# Patient Record
Sex: Female | Born: 1945 | Race: Black or African American | Hispanic: No | Smoking: Former smoker
Health system: Southern US, Community
[De-identification: ages and names within clinical notes are randomized; demographics above are authoritative.]

## PROBLEM LIST (undated history)

## (undated) DIAGNOSIS — I219 Acute myocardial infarction, unspecified: Secondary | ICD-10-CM

## (undated) DIAGNOSIS — E7800 Pure hypercholesterolemia, unspecified: Secondary | ICD-10-CM

## (undated) DIAGNOSIS — I10 Essential (primary) hypertension: Secondary | ICD-10-CM

## (undated) DIAGNOSIS — I2510 Atherosclerotic heart disease of native coronary artery without angina pectoris: Secondary | ICD-10-CM

## (undated) DIAGNOSIS — M8580 Other specified disorders of bone density and structure, unspecified site: Principal | ICD-10-CM

## (undated) DIAGNOSIS — M1990 Unspecified osteoarthritis, unspecified site: Secondary | ICD-10-CM

## (undated) DIAGNOSIS — Z9889 Other specified postprocedural states: Secondary | ICD-10-CM

## (undated) DIAGNOSIS — N200 Calculus of kidney: Secondary | ICD-10-CM

## (undated) DIAGNOSIS — K589 Irritable bowel syndrome without diarrhea: Secondary | ICD-10-CM

## (undated) DIAGNOSIS — K529 Noninfective gastroenteritis and colitis, unspecified: Secondary | ICD-10-CM

## (undated) DIAGNOSIS — N879 Dysplasia of cervix uteri, unspecified: Secondary | ICD-10-CM

## (undated) DIAGNOSIS — I459 Conduction disorder, unspecified: Secondary | ICD-10-CM

## (undated) DIAGNOSIS — K219 Gastro-esophageal reflux disease without esophagitis: Secondary | ICD-10-CM

## (undated) DIAGNOSIS — Z8719 Personal history of other diseases of the digestive system: Secondary | ICD-10-CM

## (undated) HISTORY — DX: Calculus of kidney: N20.0

## (undated) HISTORY — DX: Essential (primary) hypertension: I10

## (undated) HISTORY — DX: Dysplasia of cervix uteri, unspecified: N87.9

## (undated) HISTORY — DX: Irritable bowel syndrome, unspecified: K58.9

## (undated) HISTORY — DX: Noninfective gastroenteritis and colitis, unspecified: K52.9

## (undated) HISTORY — DX: Pure hypercholesterolemia, unspecified: E78.00

## (undated) HISTORY — DX: Conduction disorder, unspecified: I45.9

## (undated) HISTORY — PX: GYNECOLOGIC CRYOSURGERY: SHX857

## (undated) HISTORY — PX: APPENDECTOMY: SHX54

## (undated) HISTORY — DX: Other specified disorders of bone density and structure, unspecified site: M85.80

---

## 1986-06-01 DIAGNOSIS — N879 Dysplasia of cervix uteri, unspecified: Secondary | ICD-10-CM

## 1986-06-01 HISTORY — DX: Dysplasia of cervix uteri, unspecified: N87.9

## 1997-10-24 ENCOUNTER — Other Ambulatory Visit: Admission: RE | Admit: 1997-10-24 | Discharge: 1997-10-24 | Payer: Self-pay | Admitting: Obstetrics and Gynecology

## 1999-11-20 ENCOUNTER — Other Ambulatory Visit: Admission: RE | Admit: 1999-11-20 | Discharge: 1999-11-20 | Payer: Self-pay | Admitting: Obstetrics and Gynecology

## 2002-11-23 ENCOUNTER — Other Ambulatory Visit: Admission: RE | Admit: 2002-11-23 | Discharge: 2002-11-23 | Payer: Self-pay | Admitting: Obstetrics and Gynecology

## 2004-01-25 ENCOUNTER — Encounter: Admission: RE | Admit: 2004-01-25 | Discharge: 2004-01-25 | Payer: Self-pay | Admitting: Internal Medicine

## 2004-04-17 ENCOUNTER — Other Ambulatory Visit: Admission: RE | Admit: 2004-04-17 | Discharge: 2004-04-17 | Payer: Self-pay | Admitting: Obstetrics and Gynecology

## 2004-08-01 ENCOUNTER — Encounter: Admission: RE | Admit: 2004-08-01 | Discharge: 2004-08-01 | Payer: Self-pay | Admitting: Internal Medicine

## 2004-11-13 ENCOUNTER — Ambulatory Visit (HOSPITAL_COMMUNITY): Admission: RE | Admit: 2004-11-13 | Discharge: 2004-11-13 | Payer: Self-pay | Admitting: Gastroenterology

## 2004-11-13 ENCOUNTER — Encounter (INDEPENDENT_AMBULATORY_CARE_PROVIDER_SITE_OTHER): Payer: Self-pay | Admitting: *Deleted

## 2005-06-22 ENCOUNTER — Other Ambulatory Visit: Admission: RE | Admit: 2005-06-22 | Discharge: 2005-06-22 | Payer: Self-pay | Admitting: Obstetrics and Gynecology

## 2006-04-12 ENCOUNTER — Encounter: Admission: RE | Admit: 2006-04-12 | Discharge: 2006-04-12 | Payer: Self-pay | Admitting: Internal Medicine

## 2007-05-27 ENCOUNTER — Encounter: Admission: RE | Admit: 2007-05-27 | Discharge: 2007-05-27 | Payer: Self-pay | Admitting: Internal Medicine

## 2007-06-01 ENCOUNTER — Other Ambulatory Visit: Admission: RE | Admit: 2007-06-01 | Discharge: 2007-06-01 | Payer: Self-pay | Admitting: Obstetrics and Gynecology

## 2007-06-02 HISTORY — PX: OTHER SURGICAL HISTORY: SHX169

## 2007-06-02 HISTORY — PX: CARDIAC CATHETERIZATION: SHX172

## 2008-06-06 ENCOUNTER — Encounter: Admission: RE | Admit: 2008-06-06 | Discharge: 2008-06-06 | Payer: Self-pay | Admitting: Internal Medicine

## 2009-09-12 ENCOUNTER — Encounter: Admission: RE | Admit: 2009-09-12 | Discharge: 2009-09-12 | Payer: Self-pay | Admitting: Internal Medicine

## 2009-09-25 ENCOUNTER — Other Ambulatory Visit: Admission: RE | Admit: 2009-09-25 | Discharge: 2009-09-25 | Payer: Self-pay | Admitting: Obstetrics and Gynecology

## 2009-09-25 ENCOUNTER — Ambulatory Visit: Payer: Self-pay | Admitting: Obstetrics and Gynecology

## 2009-12-01 ENCOUNTER — Emergency Department (HOSPITAL_BASED_OUTPATIENT_CLINIC_OR_DEPARTMENT_OTHER): Admission: EM | Admit: 2009-12-01 | Discharge: 2009-12-01 | Payer: Self-pay | Admitting: Emergency Medicine

## 2010-10-03 ENCOUNTER — Other Ambulatory Visit: Payer: Self-pay | Admitting: Internal Medicine

## 2010-10-03 DIAGNOSIS — Z1231 Encounter for screening mammogram for malignant neoplasm of breast: Secondary | ICD-10-CM

## 2010-10-17 NOTE — Op Note (Signed)
Erika Russell, Erika Russell            ACCOUNT NO.:  [id number]   MEDICAL RECORD NO.:  [id number]          PATIENT TYPE:  AMB   LOCATION:  ENDO                         FACILITY:  [facility]   PHYSICIAN:  James L. Malon Kindle., M.D.DATE OF BIRTH:  1946-03-18   DATE OF PROCEDURE:  11/13/2004  DATE OF DISCHARGE:                                 OPERATIVE REPORT   PROCEDURE:  Colonoscopy and polypectomy.   MEDICATIONS:  1.  Fentanyl 50 mcg.  2.  Versed 6 mg IV.   SCOPE:  Olympus pediatric adjustable colonoscope.   INDICATION:  Strong family history of colon cancer in her father.   DESCRIPTION OF PROCEDURE:  The procedure explained to the patient and  consent obtained.  With the patient in the left lateral decubitus position,  the Olympus scope inserted and advanced.  Prep excellent, cecum reached,  ileocecal valve and appendiceal orifice seen.  The scope withdrawn, colon  carefully examined upon removal.  Mucosa normal throughout.  No polyps seen  throughout the entire colon.  No diverticulosis.  In the rectum, two small  polyps 3-5 mm were seen and removed with a snare.  Scope withdrawn.  The  patient tolerated the procedure well.   ASSESSMENT:  1.  Rectal polyps, removed.  211.4.  2.  Family history of colon cancer.  V16.0.   PLAN:  Recommend yearly Hemoccults and 5 year repeat colonoscopy.  Routine  postpolypectomy instructions.       JLE/MEDQ  D:  11/13/2004  T:  11/13/2004  Job:  [id number]   cc:   Wilson Singer, M.D.  [address]  [place]  [place] [address]  Fax: [phone]

## 2010-10-20 ENCOUNTER — Ambulatory Visit
Admission: RE | Admit: 2010-10-20 | Discharge: 2010-10-20 | Disposition: A | Discharge status: Home / Self Care | Source: Ambulatory Visit | Attending: Internal Medicine | Admitting: Internal Medicine

## 2010-10-20 DIAGNOSIS — Z1231 Encounter for screening mammogram for malignant neoplasm of breast: Secondary | ICD-10-CM

## 2011-05-28 ENCOUNTER — Encounter: Payer: Self-pay | Admitting: *Deleted

## 2011-05-28 DIAGNOSIS — D4989 Neoplasm of unspecified behavior of other specified sites: Secondary | ICD-10-CM | POA: Insufficient documentation

## 2011-06-04 ENCOUNTER — Encounter: Admitting: Obstetrics and Gynecology

## 2011-06-22 ENCOUNTER — Ambulatory Visit (INDEPENDENT_AMBULATORY_CARE_PROVIDER_SITE_OTHER): Payer: Medicare Other | Admitting: Obstetrics and Gynecology

## 2011-06-22 ENCOUNTER — Encounter: Payer: Self-pay | Admitting: Obstetrics and Gynecology

## 2011-06-22 ENCOUNTER — Other Ambulatory Visit (HOSPITAL_COMMUNITY)
Admission: RE | Admit: 2011-06-22 | Discharge: 2011-06-22 | Disposition: A | Discharge status: Home / Self Care | Payer: Medicare Other | Source: Ambulatory Visit | Attending: Obstetrics and Gynecology | Admitting: Obstetrics and Gynecology

## 2011-06-22 VITALS — BP 120/70 | Ht 64.0 in | Wt 132.0 lb

## 2011-06-22 DIAGNOSIS — Z124 Encounter for screening for malignant neoplasm of cervix: Secondary | ICD-10-CM

## 2011-06-22 DIAGNOSIS — N951 Menopausal and female climacteric states: Secondary | ICD-10-CM

## 2011-06-22 DIAGNOSIS — Z780 Asymptomatic menopausal state: Secondary | ICD-10-CM

## 2011-06-22 DIAGNOSIS — I459 Conduction disorder, unspecified: Secondary | ICD-10-CM | POA: Insufficient documentation

## 2011-06-22 DIAGNOSIS — M899 Disorder of bone, unspecified: Secondary | ICD-10-CM

## 2011-06-22 DIAGNOSIS — E7800 Pure hypercholesterolemia, unspecified: Secondary | ICD-10-CM | POA: Insufficient documentation

## 2011-06-22 DIAGNOSIS — B356 Tinea cruris: Secondary | ICD-10-CM

## 2011-06-22 DIAGNOSIS — M8580 Other specified disorders of bone density and structure, unspecified site: Secondary | ICD-10-CM

## 2011-06-22 DIAGNOSIS — N871 Moderate cervical dysplasia: Secondary | ICD-10-CM

## 2011-06-22 MED ORDER — OXICONAZOLE NITRATE 1 % EX LOTN: TOPICAL_LOTION | Freq: Two times a day (BID) | CUTANEOUS | Status: DC

## 2011-06-22 NOTE — Patient Instructions (Signed)
Get a mammogram and bone density in May.

## 2011-06-22 NOTE — Progress Notes (Signed)
Patient came to see me today for further follow up. She is having some hot flashes. She notices when she gets really warm that she gets itching in her lower abdomen in the panniculus. 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 She is osteopenia and is due for followup bone density. She takes calcium and vitamin D. She's not had any fractures. She is not having vaginal bleeding. She is not having pelvic pain. She does not have dysuria, frequency, urgency or incontinence.  Review of systems: 12 systems reviewed. She pertinent positives above. Other positive is cardiovascular system with hyperlipidemia and heart block.  Physical examination: Kennon Portela present. HEENT within normal limits. Neck: Thyroid not large. No masses. Supraclavicular nodes: not enlarged. Breasts: Examined in both sitting midline position. No skin changes and no masses. Abdomen: Soft no guarding rebound or masses or hernia. Pelvic: External: Within normal limits. BUS: Within normal limits. Vaginal:within normal limits. Good estrogen effect. No evidence of cystocele rectocele or enterocele. Cervix: clean. Uterus: Normal size and shape. Adnexa: No masses. Rectovaginal exam: Confirmatory and negative. Extremities: Within normal limits.  Assessment: #1. Cutaneous yeast infections #2. Night sweats #3. CIN #4. Osteopenia  Plan: Oxistat lotion ordered. Mammogram and  bone density in may. Observation of night sweats.

## 2011-06-23 ENCOUNTER — Telehealth: Payer: Self-pay | Admitting: *Deleted

## 2011-06-23 MED ORDER — OXICONAZOLE NITRATE 1 % EX CREA: TOPICAL_CREAM | CUTANEOUS | Status: DC

## 2011-06-23 NOTE — Telephone Encounter (Signed)
Pt was seen yesterday and rx for Oxistat 1 % lotion e-scribed. Pharmacy called and said that Oxistat doesn't come in a lotion, it comes in a cream. Okay to send cream?

## 2011-06-23 NOTE — Telephone Encounter (Signed)
Give her cream. When I tried to put cream in the computer only lotion came up so be careful when you prescribed it.

## 2011-06-23 NOTE — Telephone Encounter (Signed)
New rx sent

## 2011-12-07 ENCOUNTER — Other Ambulatory Visit: Payer: Self-pay | Admitting: Internal Medicine

## 2011-12-07 DIAGNOSIS — Z1231 Encounter for screening mammogram for malignant neoplasm of breast: Secondary | ICD-10-CM

## 2011-12-21 ENCOUNTER — Ambulatory Visit
Admission: RE | Admit: 2011-12-21 | Discharge: 2011-12-21 | Disposition: A | Discharge status: Home / Self Care | Payer: Medicare Other | Source: Ambulatory Visit | Attending: Internal Medicine | Admitting: Internal Medicine

## 2011-12-21 DIAGNOSIS — Z1231 Encounter for screening mammogram for malignant neoplasm of breast: Secondary | ICD-10-CM

## 2012-03-08 ENCOUNTER — Other Ambulatory Visit: Payer: Self-pay | Admitting: Internal Medicine

## 2012-03-08 DIAGNOSIS — M810 Age-related osteoporosis without current pathological fracture: Secondary | ICD-10-CM

## 2012-03-21 ENCOUNTER — Ambulatory Visit
Admission: RE | Admit: 2012-03-21 | Discharge: 2012-03-21 | Disposition: A | Discharge status: Home / Self Care | Payer: Medicare Other | Source: Ambulatory Visit | Attending: Internal Medicine | Admitting: Internal Medicine

## 2012-03-21 DIAGNOSIS — M810 Age-related osteoporosis without current pathological fracture: Secondary | ICD-10-CM

## 2012-03-21 DIAGNOSIS — M8580 Other specified disorders of bone density and structure, unspecified site: Secondary | ICD-10-CM

## 2012-07-28 ENCOUNTER — Ambulatory Visit (INDEPENDENT_AMBULATORY_CARE_PROVIDER_SITE_OTHER): Payer: Medicare Other | Admitting: Gynecology

## 2012-07-28 ENCOUNTER — Encounter: Payer: Self-pay | Admitting: Gynecology

## 2012-07-28 VITALS — BP 120/66 | Ht 64.0 in | Wt 144.0 lb

## 2012-07-28 DIAGNOSIS — L293 Anogenital pruritus, unspecified: Secondary | ICD-10-CM

## 2012-07-28 DIAGNOSIS — M949 Disorder of cartilage, unspecified: Secondary | ICD-10-CM

## 2012-07-28 DIAGNOSIS — L292 Pruritus vulvae: Secondary | ICD-10-CM

## 2012-07-28 DIAGNOSIS — M8580 Other specified disorders of bone density and structure, unspecified site: Secondary | ICD-10-CM

## 2012-07-28 DIAGNOSIS — N952 Postmenopausal atrophic vaginitis: Secondary | ICD-10-CM

## 2012-07-28 MED ORDER — NYSTATIN-TRIAMCINOLONE 100000-0.1 UNIT/GM-% EX OINT: TOPICAL_OINTMENT | Freq: Two times a day (BID) | CUTANEOUS | Status: DC

## 2012-07-28 NOTE — Progress Notes (Signed)
Erika Russell Aug 29, 1945 [id number]        67 y.o.  [id number] for annual exam.  Former patient Dr. Eda Paschal doing well.  Past medical history,surgical history, medications, allergies, family history and social history were all reviewed and documented in the EPIC chart. ROS:  Was performed and pertinent positives and negatives are included in the history.  Exam: Kim assistant Filed Vitals:   07/28/12 1202  BP: 120/66  Height: 5\' 4"  (1.626 m)  Weight: 144 lb (65.318 kg)   General appearance  Normal Skin grossly normal Head/Neck normal with no cervical or supraclavicular adenopathy thyroid normal Lungs  clear Cardiac RR, without RMG Abdominal  soft, nontender, without masses, organomegaly or hernia Breasts  examined lying and sitting without masses, retractions, discharge or axillary adenopathy. Pelvic  Ext/BUS/vagina  normal with atrophic changes  Cervix  normal with atrophic changes  Uterus  Anteverted the axial, normal size, shape and contour, midline and mobile nontender   Adnexa  Without masses or tenderness    Anus and perineum  normal   Rectovaginal  normal sphincter tone without palpated masses or tenderness.    Assessment/Plan:  67 y.o. [name] female for annual exam.   1. Postmenopausal/atrophic vaginitis. Doing well without significant symptoms. We'll continue to monitor. Patient knows to report any bleeding. 2. Mons pubis itching. Patient does have itching in the mons pubis area intermittently particularly when she sweats. Exam is normal. I gave her prescription for Mytrex cream to apply when necessary to see if this doesn't help. Follow up if it persists. 3. Osteopenia. DEXA 03/2012 with T score -1.5. FRAX 4%/0.4%. Stable from prior studies. Increase calcium vitamin D reviewed. Repeat DEXA in another year or 2. 4. Pap smear 2013. No Pap smear done today. History of cryosurgery for abnormal Pap smear 1988 with normal Pap smears since then. Patient is over the age of 58  and options to stop screening altogether versus less frequent screening interval reviewed. We'll readdress on an annual basis. 5. Mammogram 11/2011. Repeat this summer. SBE monthly reviewed. 6. Colonoscopy 5 years ago. Repeat at recommended interval. 7. Health maintenance. The blood work ordered as is done through her primary physician's office. Follow up one year, sooner as needed.    Dara Lords MD, 12:31 PM 07/28/2012

## 2012-07-28 NOTE — Patient Instructions (Addendum)
Apply cream to itchy skin as needed Follow up for annual exam in one year

## 2012-07-29 LAB — URINALYSIS W MICROSCOPIC + REFLEX CULTURE
Casts: NONE SEEN
Crystals: NONE SEEN
Glucose, UA: NEGATIVE mg/dL
Hgb urine dipstick: NEGATIVE
Ketones, ur: NEGATIVE mg/dL
Leukocytes, UA: NEGATIVE
Specific Gravity, Urine: 1.015 (ref 1.005–1.030)
pH: 7 (ref 5.0–8.0)

## 2013-02-07 ENCOUNTER — Other Ambulatory Visit: Payer: Self-pay

## 2013-02-07 DIAGNOSIS — Z1231 Encounter for screening mammogram for malignant neoplasm of breast: Secondary | ICD-10-CM

## 2013-02-16 ENCOUNTER — Emergency Department (HOSPITAL_BASED_OUTPATIENT_CLINIC_OR_DEPARTMENT_OTHER)
Admission: EM | Admit: 2013-02-16 | Discharge: 2013-02-16 | Disposition: A | Discharge status: Home / Self Care | Payer: Medicare Other | Attending: Emergency Medicine | Admitting: Emergency Medicine

## 2013-02-16 ENCOUNTER — Encounter (HOSPITAL_BASED_OUTPATIENT_CLINIC_OR_DEPARTMENT_OTHER): Payer: Self-pay

## 2013-02-16 DIAGNOSIS — Z8739 Personal history of other diseases of the musculoskeletal system and connective tissue: Secondary | ICD-10-CM | POA: Insufficient documentation

## 2013-02-16 DIAGNOSIS — E7800 Pure hypercholesterolemia, unspecified: Secondary | ICD-10-CM | POA: Insufficient documentation

## 2013-02-16 DIAGNOSIS — Z7982 Long term (current) use of aspirin: Secondary | ICD-10-CM | POA: Insufficient documentation

## 2013-02-16 DIAGNOSIS — Z87891 Personal history of nicotine dependence: Secondary | ICD-10-CM | POA: Insufficient documentation

## 2013-02-16 DIAGNOSIS — I10 Essential (primary) hypertension: Secondary | ICD-10-CM | POA: Insufficient documentation

## 2013-02-16 DIAGNOSIS — Z79899 Other long term (current) drug therapy: Secondary | ICD-10-CM | POA: Insufficient documentation

## 2013-02-16 DIAGNOSIS — Z7902 Long term (current) use of antithrombotics/antiplatelets: Secondary | ICD-10-CM | POA: Insufficient documentation

## 2013-02-16 DIAGNOSIS — E876 Hypokalemia: Secondary | ICD-10-CM

## 2013-02-16 DIAGNOSIS — Z8742 Personal history of other diseases of the female genital tract: Secondary | ICD-10-CM | POA: Insufficient documentation

## 2013-02-16 DIAGNOSIS — Z8719 Personal history of other diseases of the digestive system: Secondary | ICD-10-CM | POA: Insufficient documentation

## 2013-02-16 DIAGNOSIS — I459 Conduction disorder, unspecified: Secondary | ICD-10-CM | POA: Insufficient documentation

## 2013-02-16 LAB — CBC WITH DIFFERENTIAL/PLATELET
Basophils Absolute: 0.1 10*3/uL (ref 0.0–0.1)
Eosinophils Absolute: 0.1 10*3/uL (ref 0.0–0.7)
HCT: 32.1 % — ABNORMAL LOW (ref 36.0–46.0)
Lymphocytes Relative: 45 % (ref 12–46)
MCHC: 34.6 g/dL (ref 30.0–36.0)
Neutro Abs: 3.3 10*3/uL (ref 1.7–7.7)
RDW: 15.5 % (ref 11.5–15.5)

## 2013-02-16 LAB — BASIC METABOLIC PANEL
BUN: 12 mg/dL (ref 6–23)
Calcium: 9.2 mg/dL (ref 8.4–10.5)
GFR calc non Af Amer: 88 mL/min — ABNORMAL LOW (ref >90)
Glucose, Bld: 94 mg/dL (ref 70–99)
Sodium: 140 mEq/L (ref 135–145)

## 2013-02-16 MED ORDER — POTASSIUM CHLORIDE CRYS ER 20 MEQ PO TBCR
40.0000 meq | EXTENDED_RELEASE_TABLET | Freq: Two times a day (BID) | ORAL | Status: DC
  Administered 2013-02-16: 40 meq via ORAL

## 2013-02-16 MED ORDER — POTASSIUM CHLORIDE CRYS ER 20 MEQ PO TBCR: 40.0000 meq | EXTENDED_RELEASE_TABLET | Freq: Once | ORAL | Status: AC

## 2013-02-16 MED ORDER — POTASSIUM CHLORIDE CRYS ER 20 MEQ PO TBCR
EXTENDED_RELEASE_TABLET | ORAL | Status: AC
  Filled 2013-02-16: qty 2

## 2013-02-16 NOTE — ED Provider Notes (Signed)
CSN: [id number]     Arrival date & time 02/16/13  1630 History  This chart was scribed for non-physician practitioner, Langston Masker, PA-C working with Rolan Bucco, MD by Greggory Stallion, ED scribe. This patient was seen in room MH05/MH05 and the patient's care was started at 6:22 PM.   Chief Complaint  Patient presents with  . Hypertension   The history is provided by the patient. No language interpreter was used.    HPI Comments: Erika Russell is a 67 y.o. female who presents to the Emergency Department complaining of hypertension that started 4 days ago. She went to her PCP 4 days ago for the same but was treated for sinusitis with an antibiotic and a steroid. Pt has been on the antibiotic for 2 days. She was also prescribed Levaquin and tessalon. Pt states she went to the gym 3 days ago and her blood pressure was taken and states her systolic was 186. She took her blood pressure at [facility] and it was 206 then went back down to 186. Pt denies CP and productive cough. Pt states she smokes cigarettes sometimes but rarely.   Cardiologist is Dr. Jeralene Huff    Past Medical History  Diagnosis Date  . Hypertension   . Elevated cholesterol   . Heart block   . IBS (irritable bowel syndrome)   . Cervical dysplasia 1988  . Osteopenia 03/2012    t score - 1.5 FRAX 4%/0.4%   Past Surgical History  Procedure Laterality Date  . Cardiac stint  09  . Appendectomy    . Colposcopy    . Gynecologic cryosurgery     Family History  Problem Relation Age of Onset  . Cancer Father     COLON  . Diabetes Father   . Breast cancer Sister     Age 51's   History  Substance Use Topics  . Smoking status: Former Games developer  . Smokeless tobacco: Never Used  . Alcohol Use: 0.0 oz/week     Comment: red wine x 1 nightly   OB History   Grav Para Term Preterm Abortions TAB SAB Ect Mult Living   3 2 2  1  1   2      Review of Systems  Respiratory: Negative for cough.   Cardiovascular: Negative for chest  pain.       Positive hypertension  All other systems reviewed and are negative.    Allergies  Codeine  Home Medications   Current Outpatient Rx  Name  Route  Sig  Dispense  Refill  . aspirin 81 MG tablet   Oral   Take 81 mg by mouth daily.         . Atorvastatin Calcium (LIPITOR PO)   Oral   Take by mouth.           . Calcium Carbonate-Vitamin D (CALCIUM + D PO)   Oral   Take by mouth.           . cetirizine (ZYRTEC) 10 MG tablet   Oral   Take 10 mg by mouth daily.           . Cholecalciferol (VITAMIN D) 400 UNITS capsule   Oral   Take 400 Units by mouth daily.           Marland Kitchen Clopidogrel Bisulfate (PLAVIX PO)   Oral   Take by mouth.           . Coenzyme Q10 (CO Q-10 PO)   Oral  Take by mouth.           . Dexlansoprazole (DEXILANT PO)   Oral   Take by mouth.         . GLYCOPYRROLATE PO   Oral   Take by mouth.         . metoprolol succinate (TOPROL XL) 25 MG 24 hr tablet   Oral   Take 25 mg by mouth daily.         . Multiple Vitamin (MULTIVITAMIN) tablet   Oral   Take 1 tablet by mouth daily.           Marland Kitchen nystatin-triamcinolone ointment (MYCOLOG)   Topical   Apply topically 2 (two) times daily.   30 g   1   . oxiconazole (OXISTAT) 1 % CREA      Apply tropically 2 times daily   30 each   2   . Valsartan (DIOVAN PO)   Oral   Take by mouth.            BP 184/81  Pulse 71  Temp(Src) 99.3 F (37.4 C) (Oral)  Resp 16  Ht 5\' 3"  (1.6 m)  Wt 140 lb (63.504 kg)  BMI 24.81 kg/m2  SpO2 98%  Physical Exam  Nursing note and vitals reviewed. Constitutional: She is oriented to person, place, and time. She appears well-developed and well-nourished. No distress.  HENT:  Head: Normocephalic and atraumatic.  Eyes: EOM are normal.  Neck: Neck supple. No tracheal deviation present.  Cardiovascular: Normal rate and regular rhythm.   Pulmonary/Chest: Effort normal and breath sounds normal. No respiratory distress. She has no  wheezes. She has no rales.  Musculoskeletal: Normal range of motion.  Neurological: She is alert and oriented to person, place, and time.  Skin: Skin is warm and dry.  Psychiatric: She has a normal mood and affect. Her behavior is normal.    ED Course  Procedures (including critical care time)  DIAGNOSTIC STUDIES: Oxygen Saturation is 98% on RA, normal by my interpretation.    COORDINATION OF CARE: 6:28 PM-Discussed treatment plan which includes blood work and EKG with pt at bedside and pt agreed to plan.   Labs Review Labs Reviewed - No data to display Imaging Review No results found. Results for orders placed during the hospital encounter of 02/16/13  CBC WITH DIFFERENTIAL      Result Value Range   WBC 7.8  4.0 - 10.5 K/uL   RBC 5.20 (*) 3.87 - 5.11 MIL/uL   Hemoglobin 11.1 (*) 12.0 - 15.0 g/dL   HCT 16.1 (*) 09.6 - 04.5 %   MCV 61.7 (*) 78.0 - 100.0 fL   MCH 21.3 (*) 26.0 - 34.0 pg   MCHC 34.6  30.0 - 36.0 g/dL   RDW 40.9  81.1 - 91.4 %   Platelets 241  150 - 400 K/uL   Neutrophils Relative % 42 (*) 43 - 77 %   Lymphocytes Relative 45  12 - 46 %   Monocytes Relative 11  3 - 12 %   Eosinophils Relative 1  0 - 5 %   Basophils Relative 1  0 - 1 %   Neutro Abs 3.3  1.7 - 7.7 K/uL   Lymphs Abs 3.4  0.7 - 4.0 K/uL   Monocytes Absolute 0.9  0.1 - 1.0 K/uL   Eosinophils Absolute 0.1  0.0 - 0.7 K/uL   Basophils Absolute 0.1  0.0 - 0.1 K/uL   RBC Morphology STOMATOCYTES  Smear Review PLATELET COUNT CONFIRMED BY SMEAR    BASIC METABOLIC PANEL      Result Value Range   Sodium 140  135 - 145 mEq/L   Potassium 2.6 (*) 3.5 - 5.1 mEq/L   Chloride 102  96 - 112 mEq/L   CO2 27  19 - 32 mEq/L   Glucose, Bld 94  70 - 99 mg/dL   BUN 12  6 - 23 mg/dL   Creatinine, Ser 1.61  0.50 - 1.10 mg/dL   Calcium 9.2  8.4 - 09.6 mg/dL   GFR calc non Af Amer 88 (*) >90 mL/min   GFR calc Af Amer >90  >90 mL/min  TROPONIN I      Result Value Range   Troponin I <0.30  <0.30 ng/mL   No  results found.  MDM   1. Hypertension   2. Hypokalemia    Date: 02/16/2013  Rate: 55  Rhythm: sinus bradycardia  QRS Axis: normal  Intervals: normal  ST/T Wave abnormalities: nonspecific T wave changes  Conduction Disutrbances:none  Narrative Interpretation:   Old EKG Reviewed: none available  Pt given potassium 40mg .   Pt advised to see her Md for recheck on Monday.  Stop decongestants and nasal sprays.     I personally performed the services in this documentation, which was scribed in my presence.  The recorded information has been reviewed and considered.   Barnet Pall.  Lonia Skinner Gem, PA-C 02/16/13 2245

## 2013-02-16 NOTE — ED Notes (Signed)
rec'd call from lab with critical K+ 2.6, [place], [place] made aware.

## 2013-02-16 NOTE — ED Notes (Signed)
Pt reports that she has recently been taking mucinex and nasonex for sinus problems

## 2013-02-16 NOTE — ED Notes (Signed)
Pt reports elevated BP at home x 4 days-was seen by PCP 4 days ago for elevated BP-ws not elevated at PCP-dx with sinusitis rx levaquin, tessalon

## 2013-02-16 NOTE — ED Provider Notes (Signed)
Medical screening examination/treatment/procedure(s) were performed by non-physician practitioner and as supervising physician I was immediately available for consultation/collaboration.   Bettejane Leavens, MD 02/16/13 2355 

## 2013-02-16 NOTE — ED Notes (Signed)
Pt placed on cardiac monitor 

## 2013-02-16 NOTE — ED Notes (Signed)
Family at bedside. 

## 2013-02-16 NOTE — ED Notes (Signed)
MD at bedside. 

## 2013-02-24 ENCOUNTER — Ambulatory Visit: Payer: Medicare Other

## 2013-02-28 ENCOUNTER — Ambulatory Visit
Admission: RE | Admit: 2013-02-28 | Discharge: 2013-02-28 | Disposition: A | Discharge status: Home / Self Care | Payer: Medicare Other | Source: Ambulatory Visit

## 2013-02-28 DIAGNOSIS — Z1231 Encounter for screening mammogram for malignant neoplasm of breast: Secondary | ICD-10-CM

## 2013-07-31 ENCOUNTER — Encounter: Payer: Self-pay | Admitting: Gynecology

## 2013-07-31 ENCOUNTER — Ambulatory Visit (INDEPENDENT_AMBULATORY_CARE_PROVIDER_SITE_OTHER): Payer: Medicare Other | Admitting: Gynecology

## 2013-07-31 VITALS — BP 120/64 | Ht 63.0 in | Wt 140.0 lb

## 2013-07-31 DIAGNOSIS — M8580 Other specified disorders of bone density and structure, unspecified site: Secondary | ICD-10-CM

## 2013-07-31 DIAGNOSIS — N952 Postmenopausal atrophic vaginitis: Secondary | ICD-10-CM

## 2013-07-31 DIAGNOSIS — M899 Disorder of bone, unspecified: Secondary | ICD-10-CM

## 2013-07-31 DIAGNOSIS — M949 Disorder of cartilage, unspecified: Secondary | ICD-10-CM

## 2013-07-31 NOTE — Progress Notes (Signed)
Erika Russell [birth year] [id number]        68 y.o.  [id number] for followup exam.  Several issues and below.  Past medical history,surgical history, problem list, medications, allergies, family history and social history were all reviewed and documented in the EPIC chart.  ROS:  Performed and pertinent positives and negatives are included in the history, assessment and plan .  Exam: Kim assistant Filed Vitals:   07/31/13 1154  BP: 120/64  Height: 5\' 3"  (1.6 m)  Weight: 140 lb (63.504 kg)   General appearance  Normal Skin grossly normal Head/Neck normal with no cervical or supraclavicular adenopathy thyroid normal Lungs  clear Cardiac RR, without RMG Abdominal  soft, nontender, without masses, organomegaly or hernia Breasts  examined lying and sitting without masses, retractions, discharge or axillary adenopathy. Pelvic  Ext/BUS/vagina with generalized atrophic changes  Cervix with atrophic changes  Uterus anteverted, normal size, shape and contour, midline and mobile nontender   Adnexa  Without masses or tenderness    Anus and perineum  Normal   Rectovaginal  Normal sphincter tone without palpated masses or tenderness.    Assessment/Plan:  68 y.o. [id number] female for annual exam.   1. Postmenopausal/atrophic genital changes. Patient without significant symptoms of hot flushes, night sweats, vaginal dryness, not sexually active. No vaginal bleeding. Continue to monitor. Report any vaginal bleeding. 2. Osteopenia. DEXA 03/2012 with T score -1.5. FRAX 4%/0.4%. Planned repeat DEXA next year at 2 half-year interval. Increase calcium and vitamin D recommendations reviewed. 3. Pap smear 2013. No Pap smear done today. History of cryosurgery for cervical dysplasia 1988 with normal Pap smears since then. Options to stop screening altogether or less frequent screening intervals reviewed. Patient feels more comfortable with continued screening and we'll plan Pap smear next year a 3 year  interval. 4. Mammography 01/2013. Continue with annual mammography. SBE monthly reviewed. 5. Colonoscopy 2011. Followup at their recommended interval. 6. Health maintenance. No blood work done as she reports this all done through her primary physician's office. Followup one year, sooner as needed.   Note: This document was prepared with digital dictation and possible smart phrase technology. Any transcriptional errors that result from this process are unintentional.   Anastasio Auerbach MD, 12:26 PM 07/31/2013

## 2013-07-31 NOTE — Patient Instructions (Signed)
Follow up in one year, sooner if any issues 

## 2013-08-01 LAB — URINALYSIS W MICROSCOPIC + REFLEX CULTURE
Bilirubin Urine: NEGATIVE
CASTS: NONE SEEN
Glucose, UA: NEGATIVE mg/dL
Hgb urine dipstick: NEGATIVE
Ketones, ur: NEGATIVE mg/dL
NITRITE: NEGATIVE
PH: 5.5 (ref 5.0–8.0)
Protein, ur: NEGATIVE mg/dL
SPECIFIC GRAVITY, URINE: 1.023 (ref 1.005–1.030)
UROBILINOGEN UA: 0.2 mg/dL (ref 0.0–1.0)

## 2013-08-02 ENCOUNTER — Other Ambulatory Visit: Payer: Self-pay | Admitting: Gynecology

## 2013-08-02 MED ORDER — SULFAMETHOXAZOLE-TMP DS 800-160 MG PO TABS: 1.0000 | ORAL_TABLET | Freq: Two times a day (BID) | ORAL | Status: DC

## 2013-08-03 LAB — URINE CULTURE: Colony Count: 75000

## 2013-10-06 ENCOUNTER — Encounter (HOSPITAL_BASED_OUTPATIENT_CLINIC_OR_DEPARTMENT_OTHER): Payer: Self-pay | Admitting: Emergency Medicine

## 2013-10-06 ENCOUNTER — Emergency Department (HOSPITAL_BASED_OUTPATIENT_CLINIC_OR_DEPARTMENT_OTHER): Payer: Medicare Other

## 2013-10-06 ENCOUNTER — Emergency Department (HOSPITAL_BASED_OUTPATIENT_CLINIC_OR_DEPARTMENT_OTHER)
Admission: EM | Admit: 2013-10-06 | Discharge: 2013-10-06 | Disposition: A | Discharge status: Home / Self Care | Payer: Medicare Other | Attending: Emergency Medicine | Admitting: Emergency Medicine

## 2013-10-06 DIAGNOSIS — R0981 Nasal congestion: Secondary | ICD-10-CM

## 2013-10-06 DIAGNOSIS — Z79899 Other long term (current) drug therapy: Secondary | ICD-10-CM | POA: Insufficient documentation

## 2013-10-06 DIAGNOSIS — J309 Allergic rhinitis, unspecified: Secondary | ICD-10-CM | POA: Insufficient documentation

## 2013-10-06 DIAGNOSIS — M949 Disorder of cartilage, unspecified: Secondary | ICD-10-CM

## 2013-10-06 DIAGNOSIS — Z87891 Personal history of nicotine dependence: Secondary | ICD-10-CM | POA: Insufficient documentation

## 2013-10-06 DIAGNOSIS — M899 Disorder of bone, unspecified: Secondary | ICD-10-CM | POA: Insufficient documentation

## 2013-10-06 DIAGNOSIS — I10 Essential (primary) hypertension: Secondary | ICD-10-CM | POA: Insufficient documentation

## 2013-10-06 DIAGNOSIS — Z792 Long term (current) use of antibiotics: Secondary | ICD-10-CM | POA: Insufficient documentation

## 2013-10-06 DIAGNOSIS — Z7982 Long term (current) use of aspirin: Secondary | ICD-10-CM | POA: Insufficient documentation

## 2013-10-06 DIAGNOSIS — Z8719 Personal history of other diseases of the digestive system: Secondary | ICD-10-CM | POA: Insufficient documentation

## 2013-10-06 DIAGNOSIS — Z8742 Personal history of other diseases of the female genital tract: Secondary | ICD-10-CM | POA: Insufficient documentation

## 2013-10-06 DIAGNOSIS — E7800 Pure hypercholesterolemia, unspecified: Secondary | ICD-10-CM | POA: Insufficient documentation

## 2013-10-06 MED ORDER — HYDROCOD POLST-CHLORPHEN POLST 10-8 MG/5ML PO LQCR: 5.0000 mL | Freq: Two times a day (BID) | ORAL | Status: DC

## 2013-10-06 MED ORDER — FLUTICASONE PROPIONATE 50 MCG/ACT NA SUSP: NASAL | Status: DC

## 2013-10-06 NOTE — Discharge Instructions (Signed)
Allergic Rhinitis Allergic rhinitis is when the mucous membranes in the nose respond to allergens. Allergens are particles in the air that cause your body to have an allergic reaction. This causes you to release allergic antibodies. Through a chain of events, these eventually cause you to release histamine into the blood stream. Although meant to protect the body, it is this release of histamine that causes your discomfort, such as frequent sneezing, congestion, and an itchy, runny nose.  CAUSES  Seasonal allergic rhinitis (hay fever) is caused by pollen allergens that may come from grasses, trees, and weeds. Year-round allergic rhinitis (perennial allergic rhinitis) is caused by allergens such as house dust mites, pet dander, and mold spores.  SYMPTOMS   Nasal stuffiness (congestion).  Itchy, runny nose with sneezing and tearing of the eyes. DIAGNOSIS  Your health care provider can help you determine the allergen or allergens that trigger your symptoms. If you and your health care provider are unable to determine the allergen, skin or blood testing may be used. TREATMENT  Allergic Rhinitis does not have a cure, but it can be controlled by:  Medicines and allergy shots (immunotherapy).  Avoiding the allergen. Hay fever may often be treated with antihistamines in pill or nasal spray forms. Antihistamines block the effects of histamine. There are over-the-counter medicines that may help with nasal congestion and swelling around the eyes. Check with your health care provider before taking or giving this medicine.  If avoiding the allergen or the medicine prescribed do not work, there are many new medicines your health care provider can prescribe. Stronger medicine may be used if initial measures are ineffective. Desensitizing injections can be used if medicine and avoidance does not work. Desensitization is when a patient is given ongoing shots until the body becomes less sensitive to the allergen.  Make sure you follow up with your health care provider if problems continue. HOME CARE INSTRUCTIONS It is not possible to completely avoid allergens, but you can reduce your symptoms by taking steps to limit your exposure to them. It helps to know exactly what you are allergic to so that you can avoid your specific triggers. SEEK MEDICAL CARE IF:   You have a fever.  You develop a cough that does not stop easily (persistent).  You have shortness of breath.  You start wheezing.  Symptoms interfere with normal daily activities. Document Released: 02/10/2001 Document Revised: 03/08/2013 Document Reviewed: 01/23/2013 [facility] Patient Information 2014 [place].  Upper Respiratory Infection, Adult An upper respiratory infection (URI) is also sometimes known as the common cold. The upper respiratory tract includes the nose, sinuses, throat, trachea, and bronchi. Bronchi are the airways leading to the lungs. Most people improve within 1 week, but symptoms can last up to 2 weeks. A residual cough may last even longer.  CAUSES Many different viruses can infect the tissues lining the upper respiratory tract. The tissues become irritated and inflamed and often become very moist. Mucus production is also common. A cold is contagious. You can easily spread the virus to others by oral contact. This includes kissing, sharing a glass, coughing, or sneezing. Touching your mouth or nose and then touching a surface, which is then touched by another person, can also spread the virus. SYMPTOMS  Symptoms typically develop 1 to 3 days after you come in contact with a cold virus. Symptoms vary from person to person. They may include:  Runny nose.  Sneezing.  Nasal congestion.  Sinus irritation.  Sore throat.  Loss  of voice (laryngitis).  Cough.  Fatigue.  Muscle aches.  Loss of appetite.  Headache.  Low-grade fever. DIAGNOSIS  You might diagnose your own cold based on familiar symptoms,  since most people get a cold 2 to 3 times a year. Your caregiver can confirm this based on your exam. Most importantly, your caregiver can check that your symptoms are not due to another disease such as strep throat, sinusitis, pneumonia, asthma, or epiglottitis. Blood tests, throat tests, and X-rays are not necessary to diagnose a common cold, but they may sometimes be helpful in excluding other more serious diseases. Your caregiver will decide if any further tests are required. RISKS AND COMPLICATIONS  You may be at risk for a more severe case of the common cold if you smoke cigarettes, have chronic heart disease (such as heart failure) or lung disease (such as asthma), or if you have a weakened immune system. The very young and very old are also at risk for more serious infections. Bacterial sinusitis, middle ear infections, and bacterial pneumonia can complicate the common cold. The common cold can worsen asthma and chronic obstructive pulmonary disease (COPD). Sometimes, these complications can require emergency medical care and may be life-threatening. PREVENTION  The best way to protect against getting a cold is to practice good hygiene. Avoid oral or hand contact with people with cold symptoms. Wash your hands often if contact occurs. There is no clear evidence that vitamin C, vitamin E, echinacea, or exercise reduces the chance of developing a cold. However, it is always recommended to get plenty of rest and practice good nutrition. TREATMENT  Treatment is directed at relieving symptoms. There is no cure. Antibiotics are not effective, because the infection is caused by a virus, not by bacteria. Treatment may include:  Increased fluid intake. Sports drinks offer valuable electrolytes, sugars, and fluids.  Breathing heated mist or steam (vaporizer or shower).  Eating chicken soup or other clear broths, and maintaining good nutrition.  Getting plenty of rest.  Using gargles or lozenges for  comfort.  Controlling fevers with ibuprofen or acetaminophen as directed by your caregiver.  Increasing usage of your inhaler if you have asthma. Zinc gel and zinc lozenges, taken in the first 24 hours of the common cold, can shorten the duration and lessen the severity of symptoms. Pain medicines may help with fever, muscle aches, and throat pain. A variety of non-prescription medicines are available to treat congestion and runny nose. Your caregiver can make recommendations and may suggest nasal or lung inhalers for other symptoms.  HOME CARE INSTRUCTIONS   Only take over-the-counter or prescription medicines for pain, discomfort, or fever as directed by your caregiver.  Use a warm mist humidifier or inhale steam from a shower to increase air moisture. This may keep secretions moist and make it easier to breathe.  Drink enough water and fluids to keep your urine clear or pale yellow.  Rest as needed.  Return to work when your temperature has returned to normal or as your caregiver advises. You may need to stay home longer to avoid infecting others. You can also use a face mask and careful hand washing to prevent spread of the virus. SEEK MEDICAL CARE IF:   After the first few days, you feel you are getting worse rather than better.  You need your caregiver's advice about medicines to control symptoms.  You develop chills, worsening shortness of breath, or brown or red sputum. These may be signs of pneumonia.  You  develop yellow or brown nasal discharge or pain in the face, especially when you bend forward. These may be signs of sinusitis.  You develop a fever, swollen neck glands, pain with swallowing, or white areas in the back of your throat. These may be signs of strep throat. SEEK IMMEDIATE MEDICAL CARE IF:   You have a fever.  You develop severe or persistent headache, ear pain, sinus pain, or chest pain.  You develop wheezing, a prolonged cough, cough up blood, or have a  change in your usual mucus (if you have chronic lung disease).  You develop sore muscles or a stiff neck. Document Released: 11/11/2000 Document Revised: 08/10/2011 Document Reviewed: 09/19/2010 [facility] Patient Information 2014 [place], [place].

## 2013-10-06 NOTE — ED Provider Notes (Signed)
CSN: [id number]     Arrival date & time 10/06/13  1906 History  This chart was scribed for Tanna Furry, MD by Donato Schultz, ED Scribe. This patient was seen in room MH08/MH08 and the patient's care was started at 8:31 PM.     Chief Complaint  Patient presents with  . Cough    Patient is a 68 y.o. female presenting with cough. The history is provided by the patient. No language interpreter was used.  Cough Associated symptoms: no chest pain, no chills, no diaphoresis, no fever, no headaches, no rash, no shortness of breath, no sore throat and no wheezing    HPI Comments: Erika Russell is a 68 y.o. female who presents to the Emergency Department complaining of constant cough productive of yellow sputum that started two days ago.  She states that her symptoms started with sinus congestion that eventually migrated to her chest.  She denies seeing any blood in her sputum.  The patient states that she has taken Zyrtec with no relief to her symptoms.  She states that she is currently taking blood pressure medication which has limited the amount of medication she can take.  The patient has a history of sinus infection that require antibiotics.  She states that she is allergic to codeine.    Past Medical History  Diagnosis Date  . Hypertension   . Elevated cholesterol   . Heart block   . IBS (irritable bowel syndrome)   . Cervical dysplasia 1988  . Osteopenia 03/2012    t score - 1.5 FRAX 4%/0.4%   Past Surgical History  Procedure Laterality Date  . Cardiac stint  09  . Appendectomy    . Gynecologic cryosurgery     Family History  Problem Relation Age of Onset  . Cancer Father     COLON  . Diabetes Father   . Breast cancer Sister     Age 42's   History  Substance Use Topics  . Smoking status: Former Research scientist (life sciences)  . Smokeless tobacco: Never Used  . Alcohol Use: 0.0 oz/week     Comment: red wine x 1 nightly   OB History   Grav Para Term Preterm Abortions TAB SAB Ect Mult Living   3  2 2  1  1   2      Review of Systems  Constitutional: Negative for fever, chills, diaphoresis, appetite change and fatigue.  HENT: Positive for congestion. Negative for mouth sores, sore throat and trouble swallowing.   Eyes: Negative for visual disturbance.  Respiratory: Positive for cough. Negative for chest tightness, shortness of breath and wheezing.   Cardiovascular: Negative for chest pain.  Gastrointestinal: Negative for nausea, vomiting, abdominal pain, diarrhea and abdominal distention.  Endocrine: Negative for polydipsia, polyphagia and polyuria.  Genitourinary: Negative for dysuria, frequency and hematuria.  Musculoskeletal: Negative for gait problem.  Skin: Negative for color change, pallor and rash.  Neurological: Negative for dizziness, syncope, light-headedness and headaches.  Hematological: Does not bruise/bleed easily.  Psychiatric/Behavioral: Negative for behavioral problems and confusion.      Allergies  Codeine  Home Medications   Prior to Admission medications   Medication Sig Start Date End Date Taking? Authorizing Provider  amLODipine (NORVASC) 5 MG tablet Take 5 mg by mouth daily.    Historical Provider, MD  aspirin 81 MG tablet Take 81 mg by mouth daily.    Historical Provider, MD  Atorvastatin Calcium (LIPITOR PO) Take by mouth.      Historical Provider, MD  Calcium Carbonate-Vitamin D (CALCIUM + D PO) Take by mouth.      Historical Provider, MD  cetirizine (ZYRTEC) 10 MG tablet Take 10 mg by mouth daily.      Historical Provider, MD  Cholecalciferol (VITAMIN D) 400 UNITS capsule Take 400 Units by mouth daily.      Historical Provider, MD  Coenzyme Q10 (CO Q-10 PO) Take by mouth.      Historical Provider, MD  Dexlansoprazole (DEXILANT PO) Take by mouth.    Historical Provider, MD  GLYCOPYRROLATE PO Take by mouth.    Historical Provider, MD  metoprolol succinate (TOPROL XL) 25 MG 24 hr tablet Take 25 mg by mouth daily.    Historical Provider, MD  Multiple  Vitamin (MULTIVITAMIN) tablet Take 1 tablet by mouth daily.      Historical Provider, MD  nystatin-triamcinolone ointment (MYCOLOG) Apply topically 2 (two) times daily. 07/28/12   Anastasio Auerbach, MD  olmesartan (BENICAR) 40 MG tablet Take 40 mg by mouth daily.    Historical Provider, MD  sulfamethoxazole-trimethoprim (BACTRIM DS) 800-160 MG per tablet Take 1 tablet by mouth 2 (two) times daily. 08/02/13   Anastasio Auerbach, MD   Triage Vitals: BP 178/70  Pulse 97  Temp(Src) 98.7 F (37.1 C) (Oral)  Resp 20  Ht 5\' 3"  (1.6 m)  Wt 139 lb (63.05 kg)  BMI 24.63 kg/m2  SpO2 97%  Physical Exam  Constitutional: She is oriented to person, place, and time. She appears well-developed and well-nourished. No distress.  HENT:  Head: Normocephalic.  Nose: Rhinorrhea present.  Eyes: Conjunctivae are normal. Pupils are equal, round, and reactive to light. No scleral icterus.  Neck: Normal range of motion. Neck supple. No thyromegaly present.  Cardiovascular: Normal rate and regular rhythm.  Exam reveals no gallop and no friction rub.   No murmur heard. Pulmonary/Chest: Effort normal and breath sounds normal. No respiratory distress. She has no wheezes. She has no rales.  Abdominal: Soft. Bowel sounds are normal. She exhibits no distension. There is no tenderness. There is no rebound.  Musculoskeletal: Normal range of motion.  Neurological: She is alert and oriented to person, place, and time.  Skin: Skin is warm and dry. No rash noted.  Psychiatric: She has a normal mood and affect. Her behavior is normal.    ED Course  Procedures (including critical care time)  DIAGNOSTIC STUDIES: Oxygen Saturation is 97% on room air, adequate by my interpretation.    COORDINATION OF CARE: 8:34 PM- Discussed discharging the patient with a steroid nasal spray, an antibiotic, and cough suppressant.  Advised the patient to wait 48 hours before filling the prescription for the antibiotic.  The patient agreed to  the treatment plan.   Labs Review Labs Reviewed - No data to display  Imaging Review Dg Chest 2 View  10/06/2013   CLINICAL DATA:  Productive cough for 3 days  EXAM: CHEST  2 VIEW  COMPARISON:  None.  FINDINGS: Mild to moderate sigmoid scoliosis of the thoracolumbar spine. The heart size and vascular pattern are normal. Aortic arch calcification noted. No consolidation or effusion.  IMPRESSION: No active cardiopulmonary disease.   Electronically Signed   By: Skipper Cliche M.D.   On: 10/06/2013 19:25     EKG Interpretation None      MDM   Final diagnoses:  Nasal congestion  Allergic rhinitis    Normal chest x-ray. Not febrile. Not hypoxemic. Plan will be Flonase, continue her Allegra, Tussionex for cough, premature followup.  I personally performed the services described in this documentation, which was scribed in my presence. The recorded information has been reviewed and is accurate.    Tanna Furry, MD 10/06/13 2052

## 2013-10-06 NOTE — ED Notes (Signed)
Cough with yellow sputum x 3 days. States her allergies are really bothering her. No relief with Zyrtec.

## 2014-04-02 ENCOUNTER — Encounter (HOSPITAL_BASED_OUTPATIENT_CLINIC_OR_DEPARTMENT_OTHER): Payer: Self-pay | Admitting: Emergency Medicine

## 2014-04-05 ENCOUNTER — Other Ambulatory Visit: Payer: Self-pay

## 2014-04-05 DIAGNOSIS — Z1231 Encounter for screening mammogram for malignant neoplasm of breast: Secondary | ICD-10-CM

## 2014-04-23 ENCOUNTER — Ambulatory Visit
Admission: RE | Admit: 2014-04-23 | Discharge: 2014-04-23 | Disposition: A | Discharge status: Home / Self Care | Payer: Medicare Other | Source: Ambulatory Visit

## 2014-04-23 DIAGNOSIS — Z1231 Encounter for screening mammogram for malignant neoplasm of breast: Secondary | ICD-10-CM

## 2014-06-21 ENCOUNTER — Other Ambulatory Visit: Payer: Self-pay | Admitting: Gastroenterology

## 2014-08-08 ENCOUNTER — Ambulatory Visit (INDEPENDENT_AMBULATORY_CARE_PROVIDER_SITE_OTHER): Payer: Medicare Other | Admitting: Gynecology

## 2014-08-08 ENCOUNTER — Encounter: Payer: Self-pay | Admitting: Gynecology

## 2014-08-08 ENCOUNTER — Other Ambulatory Visit (HOSPITAL_COMMUNITY)
Admission: RE | Admit: 2014-08-08 | Discharge: 2014-08-08 | Disposition: A | Discharge status: Home / Self Care | Payer: Medicare Other | Source: Ambulatory Visit | Attending: Gynecology | Admitting: Gynecology

## 2014-08-08 VITALS — BP 150/80 | Ht 64.0 in | Wt 147.0 lb

## 2014-08-08 DIAGNOSIS — Z124 Encounter for screening for malignant neoplasm of cervix: Secondary | ICD-10-CM

## 2014-08-08 DIAGNOSIS — M8580 Other specified disorders of bone density and structure, unspecified site: Secondary | ICD-10-CM | POA: Diagnosis not present

## 2014-08-08 DIAGNOSIS — N952 Postmenopausal atrophic vaginitis: Secondary | ICD-10-CM | POA: Diagnosis not present

## 2014-08-08 DIAGNOSIS — Z01419 Encounter for gynecological examination (general) (routine) without abnormal findings: Secondary | ICD-10-CM | POA: Diagnosis not present

## 2014-08-08 NOTE — Patient Instructions (Signed)
You may obtain a copy of any labs that were done today by logging onto MyChart as outlined in the instructions provided with your AVS (after visit summary). The office will not call with normal lab results but certainly if there are any significant abnormalities then we will contact you.   Health Maintenance, Female A healthy lifestyle and preventative care can promote health and wellness.  Maintain regular health, dental, and eye exams.  Eat a healthy diet. Foods like vegetables, fruits, whole grains, low-fat dairy products, and lean protein foods contain the nutrients you need without too many calories. Decrease your intake of foods high in solid fats, added sugars, and salt. Get information about a proper diet from your caregiver, if necessary.  Regular physical exercise is one of the most important things you can do for your health. Most adults should get at least 150 minutes of moderate-intensity exercise (any activity that increases your heart rate and causes you to sweat) each week. In addition, most adults need muscle-strengthening exercises on 2 or more days a week.   Maintain a healthy weight. The body mass index (BMI) is a screening tool to identify possible weight problems. It provides an estimate of body fat based on height and weight. Your caregiver can help determine your BMI, and can help you achieve or maintain a healthy weight. For adults 20 years and older:  A BMI below 18.5 is considered underweight.  A BMI of 18.5 to 24.9 is normal.  A BMI of 25 to 29.9 is considered overweight.  A BMI of 30 and above is considered obese.  Maintain normal blood lipids and cholesterol by exercising and minimizing your intake of saturated fat. Eat a balanced diet with plenty of fruits and vegetables. Blood tests for lipids and cholesterol should begin at age 61 and be repeated every 5 years. If your lipid or cholesterol levels are high, you are over 50, or you are a high risk for heart  disease, you may need your cholesterol levels checked more frequently.Ongoing high lipid and cholesterol levels should be treated with medicines if diet and exercise are not effective.  If you smoke, find out from your caregiver how to quit. If you do not use tobacco, do not start.  Lung cancer screening is recommended for adults aged 33 80 years who are at high risk for developing lung cancer because of a history of smoking. Yearly low-dose computed tomography (CT) is recommended for people who have at least a 30-pack-year history of smoking and are a current smoker or have quit within the past 15 years. A pack year of smoking is smoking an average of 1 pack of cigarettes a day for 1 year (for example: 1 pack a day for 30 years or 2 packs a day for 15 years). Yearly screening should continue until the smoker has stopped smoking for at least 15 years. Yearly screening should also be stopped for people who develop a health problem that would prevent them from having lung cancer treatment.  If you are pregnant, do not drink alcohol. If you are breastfeeding, be very cautious about drinking alcohol. If you are not pregnant and choose to drink alcohol, do not exceed 1 drink per day. One drink is considered to be 12 ounces (355 mL) of beer, 5 ounces (148 mL) of wine, or 1.5 ounces (44 mL) of liquor.  Avoid use of street drugs. Do not share needles with anyone. Ask for help if you need support or instructions about stopping  the use of drugs.  High blood pressure causes heart disease and increases the risk of stroke. Blood pressure should be checked at least every 1 to 2 years. Ongoing high blood pressure should be treated with medicines, if weight loss and exercise are not effective.  If you are 59 to 69 years old, ask your caregiver if you should take aspirin to prevent strokes.  Diabetes screening involves taking a blood sample to check your fasting blood sugar level. This should be done once every 3  years, after age 91, if you are within normal weight and without risk factors for diabetes. Testing should be considered at a younger age or be carried out more frequently if you are overweight and have at least 1 risk factor for diabetes.  Breast cancer screening is essential preventative care for women. You should practice "breast self-awareness." This means understanding the normal appearance and feel of your breasts and may include breast self-examination. Any changes detected, no matter how small, should be reported to a caregiver. Women in their 66s and 30s should have a clinical breast exam (CBE) by a caregiver as part of a regular health exam every 1 to 3 years. After age 101, women should have a CBE every year. Starting at age 100, women should consider having a mammogram (breast X-ray) every year. Women who have a family history of breast cancer should talk to their caregiver about genetic screening. Women at a high risk of breast cancer should talk to their caregiver about having an MRI and a mammogram every year.  Breast cancer gene (BRCA)-related cancer risk assessment is recommended for women who have family members with BRCA-related cancers. BRCA-related cancers include breast, ovarian, tubal, and peritoneal cancers. Having family members with these cancers may be associated with an increased risk for harmful changes (mutations) in the breast cancer genes BRCA1 and BRCA2. Results of the assessment will determine the need for genetic counseling and BRCA1 and BRCA2 testing.  The Pap test is a screening test for cervical cancer. Women should have a Pap test starting at age 57. Between ages 25 and 35, Pap tests should be repeated every 2 years. Beginning at age 37, you should have a Pap test every 3 years as long as the past 3 Pap tests have been normal. If you had a hysterectomy for a problem that was not cancer or a condition that could lead to cancer, then you no longer need Pap tests. If you are  between ages 50 and 76, and you have had normal Pap tests going back 10 years, you no longer need Pap tests. If you have had past treatment for cervical cancer or a condition that could lead to cancer, you need Pap tests and screening for cancer for at least 20 years after your treatment. If Pap tests have been discontinued, risk factors (such as a new sexual partner) need to be reassessed to determine if screening should be resumed. Some women have medical problems that increase the chance of getting cervical cancer. In these cases, your caregiver may recommend more frequent screening and Pap tests.  The human papillomavirus (HPV) test is an additional test that may be used for cervical cancer screening. The HPV test looks for the virus that can cause the cell changes on the cervix. The cells collected during the Pap test can be tested for HPV. The HPV test could be used to screen women aged 44 years and older, and should be used in women of any age  who have unclear Pap test results. After the age of 55, women should have HPV testing at the same frequency as a Pap test.  Colorectal cancer can be detected and often prevented. Most routine colorectal cancer screening begins at the age of 44 and continues through age 20. However, your caregiver may recommend screening at an earlier age if you have risk factors for colon cancer. On a yearly basis, your caregiver may provide home test kits to check for hidden blood in the stool. Use of a small camera at the end of a tube, to directly examine the colon (sigmoidoscopy or colonoscopy), can detect the earliest forms of colorectal cancer. Talk to your caregiver about this at age 86, when routine screening begins. Direct examination of the colon should be repeated every 5 to 10 years through age 13, unless early forms of pre-cancerous polyps or small growths are found.  Hepatitis C blood testing is recommended for all people born from [birth year] through 1965 and any  individual with known risks for hepatitis C.  Practice safe sex. Use condoms and avoid high-risk sexual practices to reduce the spread of sexually transmitted infections (STIs). Sexually active women aged 36 and younger should be checked for Chlamydia, which is a common sexually transmitted infection. Older women with new or multiple partners should also be tested for Chlamydia. Testing for other STIs is recommended if you are sexually active and at increased risk.  Osteoporosis is a disease in which the bones lose minerals and strength with aging. This can result in serious bone fractures. The risk of osteoporosis can be identified using a bone density scan. Women ages 20 and over and women at risk for fractures or osteoporosis should discuss screening with their caregivers. Ask your caregiver whether you should be taking a calcium supplement or vitamin D to reduce the rate of osteoporosis.  Menopause can be associated with physical symptoms and risks. Hormone replacement therapy is available to decrease symptoms and risks. You should talk to your caregiver about whether hormone replacement therapy is right for you.  Use sunscreen. Apply sunscreen liberally and repeatedly throughout the day. You should seek shade when your shadow is shorter than you. Protect yourself by wearing long sleeves, pants, a wide-brimmed hat, and sunglasses year round, whenever you are outdoors.  Notify your caregiver of new moles or changes in moles, especially if there is a change in shape or color. Also notify your caregiver if a mole is larger than the size of a pencil eraser.  Stay current with your immunizations. Document Released: 12/01/2010 Document Revised: 09/12/2012 Document Reviewed: 12/01/2010 [facility] Patient Information 2014 [employer].

## 2014-08-08 NOTE — Addendum Note (Signed)
Addended by: Nelva Nay on: 08/08/2014 12:42 PM   Modules accepted: Orders

## 2014-08-08 NOTE — Progress Notes (Signed)
Erika Russell 28-May-1946 [id number]        69 y.o.  [id number] for breast and pelvic exam. Several issues noted below.  Past medical history,surgical history, problem list, medications, allergies, family history and social history were all reviewed and documented as reviewed in the EPIC chart.  ROS:  Performed with pertinent positives and negatives included in the history, assessment and plan.   Additional significant findings :  Symptoms related to her collagenous colitis   Exam: Kim assistant Filed Vitals:   08/08/14 1213  BP: 150/80  Height: 5\' 4"  (1.626 m)  Weight: 147 lb (66.679 kg)   General appearance:  Normal affect, orientation and appearance. Skin: Grossly normal HEENT: Without gross lesions.  No cervical or supraclavicular adenopathy. Thyroid normal.  Lungs:  Clear without wheezing, rales or rhonchi Cardiac: RR, without RMG Abdominal:  Soft, nontender, without masses, guarding, rebound, organomegaly or hernia Breasts:  Examined lying and sitting without masses, retractions, discharge or axillary adenopathy. Pelvic:  Ext/BUS/vagina with generalized atrophic changes  Cervix with atrophic changes. Pap smear done  Uterus anteverted, normal size, shape and contour, midline and mobile nontender   Adnexa  Without masses or tenderness    Anus and perineum  Normal   Rectovaginal  Normal sphincter tone without palpated masses or tenderness.    Assessment/Plan:  69 y.o. [id number] female for breast and pelvic exam.  1. Postmenopausal/atrophic genital changes. Patient without significant symptoms of hot flashes, night sweats, vaginal dryness or any vaginal bleeding. Continue to monitor and report any vaginal bleeding. 2. Osteopenia.  DEXA 03/2012 T score -1.5 FRAX 4%/0.4%. Increased calcium vitamin D reviewed. Repeat DEXA next year at 2 half-year interval. 3. Pap smear 2013. Pap smear done today. History of cervical dysplasia 1988 status post cryosurgery. Options to stop screening  altogether as she is over the age of 65versus less frequent screening intervals reviewed. Will readdress on annual basis. 4. Mammography 04/2014. Continue with annual mammography. SBE monthly reviewed. 5. Colonoscopy 2016. Recent diagnosis of collagenous colitis. Being treated for this. Will follow up with gastroenterology for this. 6. Health maintenance. Blood pressure 150/80. Alerted patient to this. Is being followed for hypertension. Has cuff at home and will monitor. If remains elevated she'll follow up with her primary physician. No routine lab work done as she reports this all done through her primary physician's office. Follow up in one year, sooner as needed.     Anastasio Auerbach MD, 12:37 PM 08/08/2014

## 2014-08-09 LAB — URINALYSIS W MICROSCOPIC + REFLEX CULTURE
BACTERIA UA: NONE SEEN
BILIRUBIN URINE: NEGATIVE
CASTS: NONE SEEN
Crystals: NONE SEEN
Glucose, UA: NEGATIVE mg/dL
HGB URINE DIPSTICK: NEGATIVE
KETONES UR: NEGATIVE mg/dL
LEUKOCYTES UA: NEGATIVE
NITRITE: NEGATIVE
PH: 5.5 (ref 5.0–8.0)
Protein, ur: NEGATIVE mg/dL
SPECIFIC GRAVITY, URINE: 1.024 (ref 1.005–1.030)
Urobilinogen, UA: 0.2 mg/dL (ref 0.0–1.0)

## 2014-08-10 LAB — CYTOLOGY - PAP

## 2014-08-16 ENCOUNTER — Emergency Department (HOSPITAL_BASED_OUTPATIENT_CLINIC_OR_DEPARTMENT_OTHER)
Admission: EM | Admit: 2014-08-16 | Discharge: 2014-08-16 | Disposition: A | Discharge status: Home / Self Care | Payer: Medicare Other | Attending: Emergency Medicine | Admitting: Emergency Medicine

## 2014-08-16 ENCOUNTER — Encounter (HOSPITAL_BASED_OUTPATIENT_CLINIC_OR_DEPARTMENT_OTHER): Payer: Self-pay | Admitting: Emergency Medicine

## 2014-08-16 ENCOUNTER — Emergency Department (HOSPITAL_BASED_OUTPATIENT_CLINIC_OR_DEPARTMENT_OTHER): Payer: Medicare Other

## 2014-08-16 DIAGNOSIS — Z87891 Personal history of nicotine dependence: Secondary | ICD-10-CM | POA: Diagnosis not present

## 2014-08-16 DIAGNOSIS — Z8739 Personal history of other diseases of the musculoskeletal system and connective tissue: Secondary | ICD-10-CM | POA: Diagnosis not present

## 2014-08-16 DIAGNOSIS — R52 Pain, unspecified: Secondary | ICD-10-CM | POA: Diagnosis present

## 2014-08-16 DIAGNOSIS — Z7952 Long term (current) use of systemic steroids: Secondary | ICD-10-CM | POA: Insufficient documentation

## 2014-08-16 DIAGNOSIS — Z87448 Personal history of other diseases of urinary system: Secondary | ICD-10-CM | POA: Insufficient documentation

## 2014-08-16 DIAGNOSIS — Z79899 Other long term (current) drug therapy: Secondary | ICD-10-CM | POA: Diagnosis not present

## 2014-08-16 DIAGNOSIS — Z8719 Personal history of other diseases of the digestive system: Secondary | ICD-10-CM | POA: Insufficient documentation

## 2014-08-16 DIAGNOSIS — I10 Essential (primary) hypertension: Secondary | ICD-10-CM | POA: Insufficient documentation

## 2014-08-16 DIAGNOSIS — E780 Pure hypercholesterolemia: Secondary | ICD-10-CM | POA: Diagnosis not present

## 2014-08-16 DIAGNOSIS — Z7982 Long term (current) use of aspirin: Secondary | ICD-10-CM | POA: Insufficient documentation

## 2014-08-16 DIAGNOSIS — J018 Other acute sinusitis: Secondary | ICD-10-CM | POA: Diagnosis not present

## 2014-08-16 MED ORDER — DEXAMETHASONE SODIUM PHOSPHATE 10 MG/ML IJ SOLN
10.0000 mg | Freq: Once | INTRAMUSCULAR | Status: AC
  Administered 2014-08-16: 10 mg via INTRAMUSCULAR
  Filled 2014-08-16: qty 1

## 2014-08-16 NOTE — ED Notes (Signed)
Patient reportd that fro about 3 -4 days she has had fever, HA and aches

## 2014-08-16 NOTE — Discharge Instructions (Signed)

## 2014-08-16 NOTE — ED Provider Notes (Signed)
CSN: [id number]     Arrival date & time 08/16/14  1925 History  This chart was scribed for Malvin Johns, MD by Randa Evens, ED Scribe. This patient was seen in room MH11/MH11 and the patient's care was started at 9:22 PM.    Chief Complaint  Patient presents with  . Chills  . Generalized Body Aches   The history is provided by the patient. No language interpreter was used.   HPI Comments: Erika Russell is a 69 y.o. female who presents to the Emergency Department complaining of non productive cough onstet 2-3 days prior. Pt states she has associated nasal congestion, post nasal drip, chills, and generalized myalgias. Pt states she has tried hydrocodone cough syrup and tessalon pearls with no slight relief. Pt doesn't report an worsening factors. Pt states she has a Hx of colitis and has chronic diarrhea that's relieved by taking PeptoBismol. Denies SOB, CP, vomiting, dysuria or difficulty urinating.    Past Medical History  Diagnosis Date  . Hypertension   . Elevated cholesterol   . Heart block   . IBS (irritable bowel syndrome)   . Cervical dysplasia 1988  . Osteopenia 03/2012    t score - 1.5 FRAX 4%/0.4%  . Colitis     collagenous   Past Surgical History  Procedure Laterality Date  . Cardiac stint  09  . Appendectomy    . Gynecologic cryosurgery     Family History  Problem Relation Age of Onset  . Cancer Father     COLON  . Diabetes Father   . Breast cancer Sister     Age 62's   History  Substance Use Topics  . Smoking status: Former Research scientist (life sciences)  . Smokeless tobacco: Never Used  . Alcohol Use: 0.0 oz/week    0 Standard drinks or equivalent per week     Comment: red wine x 1 nightly   OB History    Gravida Para Term Preterm AB TAB SAB Ectopic Multiple Living   3 2 2  1  1   2      Review of Systems  Constitutional: Positive for fever and chills. Negative for diaphoresis and fatigue.  HENT: Positive for congestion and postnasal drip. Negative for rhinorrhea  and sneezing.   Eyes: Negative.   Respiratory: Positive for cough. Negative for chest tightness and shortness of breath.   Cardiovascular: Negative for chest pain and leg swelling.  Gastrointestinal: Negative for nausea, vomiting, abdominal pain, diarrhea and blood in stool.  Genitourinary: Negative for dysuria, frequency, hematuria, flank pain and difficulty urinating.  Musculoskeletal: Positive for myalgias. Negative for back pain and arthralgias.  Skin: Negative for rash.  Neurological: Negative for dizziness, speech difficulty, weakness, numbness and headaches.  All other systems reviewed and are negative.     Allergies  Codeine  Home Medications   Prior to Admission medications   Medication Sig Start Date End Date Taking? Authorizing Provider  amLODipine (NORVASC) 5 MG tablet Take 5 mg by mouth daily.    Historical Provider, MD  aspirin 81 MG tablet Take 81 mg by mouth daily.    Historical Provider, MD  Atorvastatin Calcium (LIPITOR PO) Take by mouth.      Historical Provider, MD  Calcium Carbonate-Vitamin D (CALCIUM + D PO) Take by mouth.      Historical Provider, MD  cetirizine (ZYRTEC) 10 MG tablet Take 10 mg by mouth daily.      Historical Provider, MD  Cholecalciferol (VITAMIN D) 400 UNITS capsule Take 400  Units by mouth daily.      Historical Provider, MD  Coenzyme Q10 (CO Q-10 PO) Take by mouth.      Historical Provider, MD  Dexlansoprazole (DEXILANT PO) Take by mouth.    Historical Provider, MD  fluticasone Asencion Islam) 50 MCG/ACT nasal spray 1 spray each nares bid 10/06/13   Tanna Furry, MD  GLYCOPYRROLATE PO Take by mouth.    Historical Provider, MD  metoprolol succinate (TOPROL XL) 25 MG 24 hr tablet Take 25 mg by mouth daily.    Historical Provider, MD  Multiple Vitamin (MULTIVITAMIN) tablet Take 1 tablet by mouth daily.      Historical Provider, MD  olmesartan (BENICAR) 40 MG tablet Take 40 mg by mouth daily.    Historical Provider, MD  Probiotic Product (ALIGN) 4 MG  CAPS Take by mouth.    Historical Provider, MD   BP 126/55 mmHg  Pulse 74  Temp(Src) 99.6 F (37.6 C) (Oral)  Resp 16  Ht 5\' 3"  (1.6 m)  Wt 144 lb (65.318 kg)  BMI 25.51 kg/m2  SpO2 98%   Physical Exam  Constitutional: She is oriented to person, place, and time. She appears well-developed and well-nourished.  HENT:  Head: Normocephalic and atraumatic.  Right Ear: External ear normal.  Left Ear: External ear normal.  Mouth/Throat: Oropharynx is clear and moist.  Eyes: Pupils are equal, round, and reactive to light.  Neck: Normal range of motion. Neck supple.  Cardiovascular: Normal rate, regular rhythm and normal heart sounds.   Pulmonary/Chest: Effort normal and breath sounds normal. No respiratory distress. She has no wheezes. She has no rales. She exhibits no tenderness.  Abdominal: Soft. Bowel sounds are normal. There is no tenderness. There is no rebound and no guarding.  Musculoskeletal: Normal range of motion. She exhibits no edema.  Lymphadenopathy:    She has no cervical adenopathy.  Neurological: She is alert and oriented to person, place, and time.  Skin: Skin is warm and dry. No rash noted.  Psychiatric: She has a normal mood and affect.    ED Course  Procedures (including critical care time) DIAGNOSTIC STUDIES: Oxygen Saturation is 100% on RA, normal by my interpretation.    COORDINATION OF CARE: 9:08 PM-Discussed treatment plan with pt at bedside and pt agreed to plan.     Labs Review Labs Reviewed - No data to display  Imaging Review Dg Chest 2 View  08/16/2014   CLINICAL DATA:  Cough, congestion, body aches for 2 days  EXAM: CHEST  2 VIEW  COMPARISON:  10/06/2013  FINDINGS: There is no focal parenchymal opacity, pleural effusion, or pneumothorax. The heart and mediastinal contours are unremarkable. There is thoracic aortic atherosclerosis.  The osseous structures are unremarkable.  IMPRESSION: No active cardiopulmonary disease.   Electronically Signed    By: Kathreen Devoid   On: 08/16/2014 20:26     EKG Interpretation None      MDM   Final diagnoses:  Other acute sinusitis    Patient has no evidence of pneumonia. She has a nonproductive cough and upper respiratory symptoms. Her main complaint is nasal congestion and facial congestion. She was given a dose of Decadron in the ED. I don't see any evidence of a bacterial infection. Her symptoms of only been gone on for about 3-4 days. She has medications at home to use for symptomatic relief. She has Zyrtec and Flonase to use. She says that she normally gets these allergy symptoms every year or gets sinus congestion any  time the weather changes. She has an appointment to follow-up tomorrow with her primary care physician.  I personally performed the services described in this documentation, which was scribed in my presence.  The recorded information has been reviewed and considered.      Malvin Johns, MD 08/16/14 2124

## 2014-08-16 NOTE — ED Notes (Signed)
Ha, body aches x 3-4 days

## 2015-04-09 ENCOUNTER — Other Ambulatory Visit: Payer: Self-pay

## 2015-04-09 DIAGNOSIS — Z1231 Encounter for screening mammogram for malignant neoplasm of breast: Secondary | ICD-10-CM

## 2015-05-14 ENCOUNTER — Ambulatory Visit: Payer: TRICARE For Life (TFL)

## 2015-06-07 ENCOUNTER — Ambulatory Visit
Admission: RE | Admit: 2015-06-07 | Discharge: 2015-06-07 | Disposition: A | Discharge status: Home / Self Care | Payer: Medicare Other | Source: Ambulatory Visit

## 2015-06-07 DIAGNOSIS — Z1231 Encounter for screening mammogram for malignant neoplasm of breast: Secondary | ICD-10-CM

## 2015-08-09 ENCOUNTER — Encounter: Payer: Medicare Other | Admitting: Gynecology

## 2015-08-12 ENCOUNTER — Ambulatory Visit (INDEPENDENT_AMBULATORY_CARE_PROVIDER_SITE_OTHER): Payer: Medicare Other | Admitting: Gynecology

## 2015-08-12 ENCOUNTER — Encounter: Payer: Self-pay | Admitting: Gynecology

## 2015-08-12 VITALS — BP 124/74 | Ht 63.0 in | Wt 150.0 lb

## 2015-08-12 DIAGNOSIS — Z01419 Encounter for gynecological examination (general) (routine) without abnormal findings: Secondary | ICD-10-CM

## 2015-08-12 DIAGNOSIS — N952 Postmenopausal atrophic vaginitis: Secondary | ICD-10-CM | POA: Diagnosis not present

## 2015-08-12 DIAGNOSIS — M8580 Other specified disorders of bone density and structure, unspecified site: Secondary | ICD-10-CM

## 2015-08-12 NOTE — Patient Instructions (Signed)

## 2015-08-12 NOTE — Progress Notes (Signed)
    Erika Russell 25-Nov-1945 [id number]        70 y.o.  [id number]  for breast and pelvic exam  Past medical history,surgical history, problem list, medications, allergies, family history and social history were all reviewed and documented as reviewed in the EPIC chart.  ROS:  Performed with pertinent positives and negatives included in the history, assessment and plan.   Additional significant findings :  none   Exam: Caryn Bee assistant Filed Vitals:   08/12/15 1432  BP: 124/74  Height: 5\' 3"  (1.6 m)  Weight: 150 lb (68.04 kg)   General appearance:  Normal affect, orientation and appearance. Skin: Grossly normal HEENT: Without gross lesions.  No cervical or supraclavicular adenopathy. Thyroid normal.  Lungs:  Clear without wheezing, rales or rhonchi Cardiac: RR, without RMG Abdominal:  Soft, nontender, without masses, guarding, rebound, organomegaly or hernia Breasts:  Examined lying and sitting without masses, retractions, discharge or axillary adenopathy. Pelvic:  Ext/BUS/vagina with atrophic changes  Cervix with atrophic changes  Uterus anteverted, normal size, shape and contour, midline and mobile nontender   Adnexa without masses or tenderness    Anus and perineum normal   Rectovaginal normal sphincter tone without palpated masses or tenderness.    Assessment/Plan:  70 y.o. [id number] female for estimate pelvic exam.   1. Postmenopausal/atrophic genital changes. Without significant hot flashes, night sweats, vaginal dryness or any vaginal bleeding. Continue to monitor and report any issues or vaginal bleeding. 2. Osteopenia. DEXA 03/2012 T score -1.5 FRAX 4%/0.4%. Repeat DEXA now at 3-1/2 year interval. Patient will schedule an follow up for this. 3. Mammography 06/2015. Continue with annual mammography when due. SBE monthly reviewed. 4. Pap smear 2016. No Pap smear done today. History of dysplasia in 1988 with cryosurgery. 5. Colonoscopy 2016. Repeat at their  recommended interval. 6. Health maintenance. No routine lab work done as patient does this at her primary physician's office. Follow up 1 year, sooner as needed.   Anastasio Auerbach MD, 2:56 PM 08/12/2015

## 2015-09-30 DIAGNOSIS — M8580 Other specified disorders of bone density and structure, unspecified site: Secondary | ICD-10-CM

## 2015-09-30 HISTORY — DX: Other specified disorders of bone density and structure, unspecified site: M85.80

## 2015-10-07 ENCOUNTER — Ambulatory Visit (INDEPENDENT_AMBULATORY_CARE_PROVIDER_SITE_OTHER): Payer: Medicare Other

## 2015-10-07 ENCOUNTER — Other Ambulatory Visit: Payer: Self-pay | Admitting: Gynecology

## 2015-10-07 DIAGNOSIS — M899 Disorder of bone, unspecified: Secondary | ICD-10-CM

## 2015-10-07 DIAGNOSIS — M8580 Other specified disorders of bone density and structure, unspecified site: Secondary | ICD-10-CM

## 2015-10-08 ENCOUNTER — Encounter: Payer: Self-pay | Admitting: Gynecology

## 2015-12-06 ENCOUNTER — Encounter (HOSPITAL_BASED_OUTPATIENT_CLINIC_OR_DEPARTMENT_OTHER): Payer: Self-pay

## 2015-12-06 ENCOUNTER — Emergency Department (HOSPITAL_BASED_OUTPATIENT_CLINIC_OR_DEPARTMENT_OTHER)
Admission: EM | Admit: 2015-12-06 | Discharge: 2015-12-06 | Disposition: A | Discharge status: Home / Self Care | Payer: Medicare Other | Attending: Emergency Medicine | Admitting: Emergency Medicine

## 2015-12-06 DIAGNOSIS — L0291 Cutaneous abscess, unspecified: Secondary | ICD-10-CM

## 2015-12-06 DIAGNOSIS — Z87891 Personal history of nicotine dependence: Secondary | ICD-10-CM | POA: Insufficient documentation

## 2015-12-06 DIAGNOSIS — Z79899 Other long term (current) drug therapy: Secondary | ICD-10-CM | POA: Diagnosis not present

## 2015-12-06 DIAGNOSIS — Z7982 Long term (current) use of aspirin: Secondary | ICD-10-CM | POA: Diagnosis not present

## 2015-12-06 DIAGNOSIS — I10 Essential (primary) hypertension: Secondary | ICD-10-CM | POA: Diagnosis not present

## 2015-12-06 DIAGNOSIS — L02414 Cutaneous abscess of left upper limb: Secondary | ICD-10-CM | POA: Insufficient documentation

## 2015-12-06 MED ORDER — LIDOCAINE HCL (PF) 1 % IJ SOLN
5.0000 mL | Freq: Once | INTRAMUSCULAR | Status: AC
  Administered 2015-12-06: 5 mL
  Filled 2015-12-06: qty 5

## 2015-12-06 MED ORDER — CEPHALEXIN 500 MG PO CAPS: 500.0000 mg | ORAL_CAPSULE | Freq: Four times a day (QID) | ORAL | Status: DC

## 2015-12-06 NOTE — ED Notes (Signed)
Patient reports abscess under left arm that she noticed 6 days ago. Patient reports some bloody drainage.

## 2015-12-06 NOTE — ED Notes (Signed)
Patient left ED ambulatory without any assistance and had no further questions.

## 2015-12-06 NOTE — Discharge Instructions (Signed)
Please read and follow all provided instructions.  Your diagnoses today include:  1. Abscess    Tests performed today include:  Vital signs. See below for your results today.   Medications prescribed:   Take any prescribed medications only as directed.   Home care instructions:   Follow any educational materials contained in this packet  Follow-up instructions: Return to the Emergency Department in 48 hours for a recheck if your symptoms are not significantly improved.  Please follow-up with your primary care provider in the next 1 week for further evaluation of your symptoms.   Return instructions:  Return to the Emergency Department if you have:  Fever  Worsening symptoms  Worsening pain  Worsening swelling  Redness of the skin that moves away from the affected area, especially if it streaks away from the affected area   Any other emergent concerns  Additional Information: If you have recurrent abscesses, try both the following. Use a Qtip to apply an over-the-counter antibiotic to the inside of your nostrils, twice a day for 5 days. Wash your body with over-the-counter Hibaclens once a day for one week and then once every two weeks. This can reduce the amount of bacterial on your skin that causes boils and lead to fewer boils. If you continue to have multiple or recurrent boils, you should see a dermatologist (skin doctor).   Your vital signs today were: BP 183/84 mmHg   Pulse 90   Temp(Src) 99 F (37.2 C) (Oral)   Resp 18   Ht 5\' 3"  (1.6 m)   Wt 66.679 kg   BMI 26.05 kg/m2   SpO2 98% If your blood pressure (BP) was elevated above 135/85 this visit, please have this repeated by your doctor within one month. --------------

## 2015-12-06 NOTE — ED Provider Notes (Signed)
CSN: [id number]     Arrival date & time 12/06/15  0915 History   First MD Initiated Contact with Patient 12/06/15 [phone]     Chief Complaint  Patient presents with  . Abscess   (Consider location/radiation/quality/duration/timing/severity/associated sxs/prior Treatment) HPI 70 y.o. female with a hx of HTN, presents to the Emergency Department today complaining of left underarm abscess that she noticed x 6 days ago. States that she thinks she aggravated the area with her swimsuit. No pain currently. No fevers. No CP/SOB/ABD pain. No N/V/D. OTC warm compresses without relief. No other symptoms noted.   Past Medical History  Diagnosis Date  . Hypertension   . Elevated cholesterol   . Heart block   . IBS (irritable bowel syndrome)   . Cervical dysplasia 1988  . Osteopenia 09/2015    T score -1.8 FRAX 11%/2.8%  . Colitis     collagenous   Past Surgical History  Procedure Laterality Date  . Cardiac stint  09  . Appendectomy    . Gynecologic cryosurgery     Family History  Problem Relation Age of Onset  . Cancer Father     COLON  . Diabetes Father   . Breast cancer Sister     Age 76's   Social History  Substance Use Topics  . Smoking status: Former Research scientist (life sciences)  . Smokeless tobacco: Never Used  . Alcohol Use: 4.2 oz/week    7 Standard drinks or equivalent per week     Comment: red wine x 1 nightly   OB History    Gravida Para Term Preterm AB TAB SAB Ectopic Multiple Living   3 2 2  1  1   2      Review of Systems  Constitutional: Negative for fever.  Respiratory: Negative for shortness of breath.   Cardiovascular: Negative for chest pain.  Gastrointestinal: Negative for nausea and vomiting.  Skin: Positive for wound.     Allergies  Codeine  Home Medications   Prior to Admission medications   Medication Sig Start Date End Date Taking? Authorizing Provider  amLODipine (NORVASC) 5 MG tablet Take 5 mg by mouth daily.    Historical Provider, MD  aspirin 81 MG tablet Take  81 mg by mouth daily.    Historical Provider, MD  Atorvastatin Calcium (LIPITOR PO) Take by mouth.      Historical Provider, MD  Calcium Carbonate-Vitamin D (CALCIUM + D PO) Take by mouth.      Historical Provider, MD  cetirizine (ZYRTEC) 10 MG tablet Take 10 mg by mouth daily.      Historical Provider, MD  Cholecalciferol (VITAMIN D) 400 UNITS capsule Take 400 Units by mouth daily.      Historical Provider, MD  Dexlansoprazole (DEXILANT PO) Take by mouth.    Historical Provider, MD  fluticasone Asencion Islam) 50 MCG/ACT nasal spray 1 spray each nares bid 10/06/13   Tanna Furry, MD  GLYCOPYRROLATE PO Take by mouth.    Historical Provider, MD  metoprolol succinate (TOPROL XL) 25 MG 24 hr tablet Take 25 mg by mouth daily.    Historical Provider, MD  Multiple Vitamin (MULTIVITAMIN) tablet Take 1 tablet by mouth daily.      Historical Provider, MD  olmesartan (BENICAR) 40 MG tablet Take 40 mg by mouth daily.    Historical Provider, MD  Probiotic Product (ALIGN) 4 MG CAPS Take by mouth.    Historical Provider, MD   BP 183/84 mmHg  Pulse 90  Temp(Src) 99 F (37.2 C) (Oral)  Resp 18  Ht 5\' 3"  (1.6 m)  Wt 66.679 kg  BMI 26.05 kg/m2  SpO2 98% Physical Exam  Constitutional: She is oriented to person, place, and time. She appears well-developed and well-nourished.  HENT:  Head: Normocephalic and atraumatic.  Eyes: EOM are normal.  Cardiovascular: Normal rate and regular rhythm.   Pulmonary/Chest: Effort normal.  Abdominal: Soft.  Musculoskeletal: Normal range of motion.  <1cm abscess noted on left underarm. Indurated. Non erythematous. Non purulent. No red streaking.   Neurological: She is alert and oriented to person, place, and time.  Skin: Skin is warm and dry.  Psychiatric: She has a normal mood and affect. Her behavior is normal. Thought content normal.  Nursing note and vitals reviewed.  ED Course  .Marland KitchenIncision and Drainage Date/Time: 12/06/2015 9:34 AM Performed by: Shary Decamp Authorized  by: Shary Decamp Consent: Verbal consent obtained. Consent given by: patient Patient understanding: patient states understanding of the procedure being performed Patient consent: the patient's understanding of the procedure matches consent given Patient identity confirmed: verbally with patient and arm band Type: abscess Body area: upper extremity Location details: left arm Anesthesia: local infiltration Local anesthetic: lidocaine 1% without epinephrine Anesthetic total: 0.5 ml Scalpel size: 11 Needle gauge: 22 Incision type: single straight Incision depth: dermal Complexity: simple Drainage: serosanguinous Drainage amount: scant Wound treatment: wound left open Patient tolerance: Patient tolerated the procedure well with no immediate complications   (including critical care time) Labs Review Labs Reviewed - No data to display  Imaging Review No results found. I have personally reviewed and evaluated these images and lab results as part of my medical decision-making.   EKG Interpretation None      MDM  I have reviewed the relevant previous healthcare records. I obtained HPI from historian. Patient discussed with supervising physician  ED Course:  Assessment: Pt is a 44yF with hx HTN who presents with left underarm abscess x 6 days. On exam, pt in NAD. Nontoxic/nonseptic appearing. VSS. Afebrile. No signs of cellulitis surrounding skin. I&D performed. Given Rx Keflex. Plan is to DC home with follow up to PCP. At time of discharge, Patient is in no acute distress. Vital Signs are stable. Patient is able to ambulate. Patient able to tolerate PO.   Disposition/Plan:  DC Home Additional Verbal discharge instructions given and discussed with patient.  Pt Instructed to f/u with PCP in the next week for evaluation and treatment of symptoms. Return precautions given Pt acknowledges and agrees with plan  Supervising Physician Sherwood Gambler, MD   Final diagnoses:  Abscess     Shary Decamp, PA-C 12/06/15 [id number]  Sherwood Gambler, MD 12/07/15 [phone]

## 2015-12-07 NOTE — ED Notes (Signed)
Erika Russell called and requested a different antibiotic, due to her having diarrhea since she started keflex.  Dr Billy Fischer prescribed clindamycin 300 mg tid X 7 days. Called prescription to [facility] @ [address]

## 2016-08-12 ENCOUNTER — Encounter (HOSPITAL_BASED_OUTPATIENT_CLINIC_OR_DEPARTMENT_OTHER): Payer: Self-pay

## 2016-08-12 ENCOUNTER — Encounter: Payer: Self-pay | Admitting: Gynecology

## 2016-08-12 ENCOUNTER — Ambulatory Visit (INDEPENDENT_AMBULATORY_CARE_PROVIDER_SITE_OTHER): Payer: Medicare Other | Admitting: Gynecology

## 2016-08-12 ENCOUNTER — Emergency Department (HOSPITAL_BASED_OUTPATIENT_CLINIC_OR_DEPARTMENT_OTHER)
Admission: EM | Admit: 2016-08-12 | Discharge: 2016-08-13 | Disposition: A | Discharge status: Home / Self Care | Payer: Medicare Other | Attending: Emergency Medicine | Admitting: Emergency Medicine

## 2016-08-12 VITALS — BP 138/76 | Ht 62.5 in | Wt 146.0 lb

## 2016-08-12 DIAGNOSIS — Z7982 Long term (current) use of aspirin: Secondary | ICD-10-CM | POA: Insufficient documentation

## 2016-08-12 DIAGNOSIS — Z23 Encounter for immunization: Secondary | ICD-10-CM | POA: Diagnosis not present

## 2016-08-12 DIAGNOSIS — S80861A Insect bite (nonvenomous), right lower leg, initial encounter: Secondary | ICD-10-CM | POA: Insufficient documentation

## 2016-08-12 DIAGNOSIS — S60861A Insect bite (nonvenomous) of right wrist, initial encounter: Secondary | ICD-10-CM | POA: Insufficient documentation

## 2016-08-12 DIAGNOSIS — Z79899 Other long term (current) drug therapy: Secondary | ICD-10-CM | POA: Diagnosis not present

## 2016-08-12 DIAGNOSIS — Z01411 Encounter for gynecological examination (general) (routine) with abnormal findings: Secondary | ICD-10-CM

## 2016-08-12 DIAGNOSIS — L292 Pruritus vulvae: Secondary | ICD-10-CM

## 2016-08-12 DIAGNOSIS — Y999 Unspecified external cause status: Secondary | ICD-10-CM | POA: Insufficient documentation

## 2016-08-12 DIAGNOSIS — W57XXXA Bitten or stung by nonvenomous insect and other nonvenomous arthropods, initial encounter: Secondary | ICD-10-CM | POA: Diagnosis not present

## 2016-08-12 DIAGNOSIS — Y939 Activity, unspecified: Secondary | ICD-10-CM | POA: Insufficient documentation

## 2016-08-12 DIAGNOSIS — N952 Postmenopausal atrophic vaginitis: Secondary | ICD-10-CM

## 2016-08-12 DIAGNOSIS — I10 Essential (primary) hypertension: Secondary | ICD-10-CM | POA: Insufficient documentation

## 2016-08-12 DIAGNOSIS — Y929 Unspecified place or not applicable: Secondary | ICD-10-CM | POA: Diagnosis not present

## 2016-08-12 DIAGNOSIS — Z87891 Personal history of nicotine dependence: Secondary | ICD-10-CM | POA: Insufficient documentation

## 2016-08-12 MED ORDER — TETANUS-DIPHTH-ACELL PERTUSSIS 5-2.5-18.5 LF-MCG/0.5 IM SUSP
0.5000 mL | Freq: Once | INTRAMUSCULAR | Status: AC
  Administered 2016-08-12: 0.5 mL via INTRAMUSCULAR
  Filled 2016-08-12: qty 0.5

## 2016-08-12 MED ORDER — NYSTATIN-TRIAMCINOLONE 100000-0.1 UNIT/GM-% EX OINT: 1.0000 "application " | TOPICAL_OINTMENT | Freq: Two times a day (BID) | CUTANEOUS | 2 refills | Status: DC

## 2016-08-12 MED ORDER — HYDROCORTISONE 2.5 % EX CREA: TOPICAL_CREAM | Freq: Two times a day (BID) | CUTANEOUS | 0 refills | Status: DC

## 2016-08-12 NOTE — ED Triage Notes (Signed)
C/o possible insect bites to right LE yesterday and right UE today-both while she was outside-NAD-steady gait

## 2016-08-12 NOTE — ED Provider Notes (Signed)
[facility] Provider Note   CSN: [id number] Arrival date & time: 08/12/16  2209 By signing my name below, I, Georgette Shell, attest that this documentation has been prepared under the direction and in the presence of Waynetta Pean, PA-C. Electronically Signed: Georgette Shell, ED Scribe. 08/12/16. 11:45 PM.  History   Chief Complaint Chief Complaint  Patient presents with  . Insect Bite    HPI The history is provided by the patient. No language interpreter was used.   HPI Comments: Erika Russell is a 72 y.o. female with h/o HTN, who presents to the Emergency Department complaining of possible, pruritic, insect bites to right wrist and right hand onset today and to her RLE yesterday. Pt states she did not witness the insect bite but was outside fixing her fence at the time of onset. She has applied witch hazel and anti-fungal cream to the area with no relief. Pt denies fever, chills, abdominal pain, vomiting, diarrhea, shortness of breath, or any other associated symptoms. Pt is unsure if her Tdap is UTD.  Past Medical History:  Diagnosis Date  . Cervical dysplasia 1988  . Colitis    collagenous  . Elevated cholesterol   . Heart block   . Hypertension   . IBS (irritable bowel syndrome)   . Osteopenia 09/2015   T score -1.8 FRAX 11%/2.8%    Patient Active Problem List   Diagnosis Date Noted  . Elevated cholesterol   . Heart block   . CIN (conjunctival intraepithelial neoplasia)     Past Surgical History:  Procedure Laterality Date  . APPENDECTOMY    . CARDIAC STINT  09  . GYNECOLOGIC CRYOSURGERY      OB History    Gravida Para Term Preterm AB Living   3 2 2   1 2    SAB TAB Ectopic Multiple Live Births   1               Home Medications    Prior to Admission medications   Medication Sig Start Date End Date Taking? Authorizing Provider  amLODipine (NORVASC) 5 MG tablet Take 5 mg by mouth daily.    Historical Provider, MD  aspirin 81 MG tablet Take 81 mg  by mouth daily.    Historical Provider, MD  Atorvastatin Calcium (LIPITOR PO) Take by mouth.      Historical Provider, MD  Calcium Carbonate-Vitamin D (CALCIUM + D PO) Take by mouth.      Historical Provider, MD  cephALEXin (KEFLEX) 500 MG capsule Take 1 capsule (500 mg total) by mouth 4 (four) times daily. 12/06/15   Shary Decamp, PA-C  cetirizine (ZYRTEC) 10 MG tablet Take 10 mg by mouth daily.      Historical Provider, MD  Cholecalciferol (VITAMIN D) 400 UNITS capsule Take 400 Units by mouth daily.      Historical Provider, MD  Dexlansoprazole (DEXILANT PO) Take by mouth.    Historical Provider, MD  fluticasone Asencion Islam) 50 MCG/ACT nasal spray 1 spray each nares bid 10/06/13   Tanna Furry, MD  GLYCOPYRROLATE PO Take by mouth.    Historical Provider, MD  hydrocortisone 2.5 % cream Apply topically 2 (two) times daily. 08/12/16   Waynetta Pean, PA-C  metoprolol succinate (TOPROL XL) 25 MG 24 hr tablet Take 25 mg by mouth daily.    Historical Provider, MD  Multiple Vitamin (MULTIVITAMIN) tablet Take 1 tablet by mouth daily.      Historical Provider, MD  nystatin-triamcinolone ointment (MYCOLOG) Apply 1 application  topically 2 (two) times daily. 08/12/16   Anastasio Auerbach, MD  olmesartan (BENICAR) 40 MG tablet Take 40 mg by mouth daily.    Historical Provider, MD  Probiotic Product (ALIGN) 4 MG CAPS Take by mouth.    Historical Provider, MD    Family History Family History  Problem Relation Age of Onset  . Cancer Father     COLON  . Diabetes Father   . Breast cancer Sister     Age 65's    Social History Social History  Substance Use Topics  . Smoking status: Former Research scientist (life sciences)  . Smokeless tobacco: Never Used  . Alcohol use 4.2 oz/week    7 Standard drinks or equivalent per week     Comment: red wine x 1 nightly     Allergies   Codeine   Review of Systems Review of Systems  Constitutional: Negative for chills and fever.  Respiratory: Negative for shortness of breath.     Gastrointestinal: Negative for abdominal pain, nausea and vomiting.  Skin: Positive for rash.   Physical Exam Updated Vital Signs BP 179/82   Pulse 63   Temp 98.3 F (36.8 C) (Oral)   Resp 16   Ht 5' 2.5" (1.588 m)   Wt 65.8 kg   SpO2 100%   BMI 26.10 kg/m   Physical Exam  Constitutional: She appears well-developed and well-nourished. No distress.  Nontoxic appearing.  HENT:  Head: Normocephalic and atraumatic.  Mouth/Throat: Oropharynx is clear and moist.  Eyes: Right eye exhibits no discharge. Left eye exhibits no discharge.  Neck: Neck supple.  Cardiovascular: Normal rate, regular rhythm, normal heart sounds and intact distal pulses.   Pulmonary/Chest: Effort normal. No respiratory distress.  Abdominal: Soft. There is no tenderness.  Musculoskeletal: She exhibits no edema or tenderness.  Lymphadenopathy:    She has no cervical adenopathy.  Neurological: She is alert. Coordination normal.  Skin: Skin is warm and dry. Capillary refill takes less than 2 seconds. No rash noted. She is not diaphoretic. No erythema. No pallor.  Two 1 cm lesions to her right lower shin and to her right wrist. No lesions between webbed spaces of fingers. No erythema or edema. No discharge. No evidence of infection.  Psychiatric: She has a normal mood and affect. Her behavior is normal.  Nursing note and vitals reviewed.    ED Treatments / Results  DIAGNOSTIC STUDIES: Oxygen Saturation is 100% on RA, normal by my interpretation.    COORDINATION OF CARE: 11:44 PM Discussed treatment plan with pt at bedside and pt agreed to plan.  Labs (all labs ordered are listed, but only abnormal results are displayed) Labs Reviewed - No data to display  EKG  EKG Interpretation None       Radiology No results found.  Procedures Procedures (including critical care time)  Medications Ordered in ED Medications  Tdap (BOOSTRIX) injection 0.5 mL (0.5 mLs Intramuscular Given 08/12/16 2356)      Initial Impression / Assessment and Plan / ED Course  I have reviewed the triage vital signs and the nursing notes.  Pertinent labs & imaging results that were available during my care of the patient were reviewed by me and considered in my medical decision making (see chart for details).     Patient presented with several small insect bites to her right hand and right shin. There is no evidence of infection. They appear to be simple insect bites. She reports they're itchy. She is afebrile nontoxic appearing. Will treat  with hydrocortisone cream. Patient also tells me she is unsure when her last tetanus shot was. Will update her tetanus in the emergency department today. I encouraged her to follow with primary care discussed strict and specific return precautions. I advised the patient to follow-up with their primary care provider this week. I advised the patient to return to the emergency department with new or worsening symptoms or new concerns. The patient verbalized understanding and agreement with plan.      Final Clinical Impressions(s) / ED Diagnoses   Final diagnoses:  Insect bite, initial encounter  Need for Tdap vaccination    New Prescriptions Discharge Medication List as of 08/12/2016 11:51 PM    START taking these medications   Details  hydrocortisone 2.5 % cream Apply topically 2 (two) times daily., Starting Wed 08/12/2016, Print       I personally performed the services described in this documentation, which was scribed in my presence. The recorded information has been reviewed and is accurate.      Waynetta Pean, PA-C 08/13/16 0126    Shanon Rosser, MD 08/13/16 [phone]

## 2016-08-12 NOTE — Patient Instructions (Signed)
Follow up for your mammogram.

## 2016-08-12 NOTE — Progress Notes (Signed)
    Erika Russell 1945/10/03 [id number]        70 y.o.  [id number] for breast and pelvic exam.  Also complaining of mons pubis irritation/itching. Comes and goes particularly when she sweating. No vaginal discharge irritation or odor. No urinary symptoms such as frequency dysuria or urgency.  Past medical history,surgical history, problem list, medications, allergies, family history and social history were all reviewed and documented as reviewed in the EPIC chart.  ROS:  Performed with pertinent positives and negatives included in the history, assessment and plan.   Additional significant findings :  None   Exam: Copywriter, advertising Vitals:   08/12/16 1358  BP: 138/76  Weight: 146 lb (66.2 kg)  Height: 5' 2.5" (1.588 m)   Body mass index is 26.28 kg/m.  General appearance:  Normal affect, orientation and appearance. Skin: Grossly normal HEENT: Without gross lesions.  No cervical or supraclavicular adenopathy. Thyroid normal.  Lungs:  Clear without wheezing, rales or rhonchi Cardiac: RR, without RMG Abdominal:  Soft, nontender, without masses, guarding, rebound, organomegaly or hernia Breasts:  Examined lying and sitting without masses, retractions, discharge or axillary adenopathy. Pelvic:  Ext, BUS, Vagina: With atrophic changes. No rashes or other lesions on the mons pubis.  Cervix: Atrophic  Uterus: Anteverted, normal size, shape and contour, midline and mobile nontender   Adnexa: Without masses or tenderness    Anus and perineum: Normal   Rectovaginal: Normal sphincter tone without palpated masses or tenderness.    Assessment/Plan:  71 y.o. [id number] female for breast and pelvic exam  1. Postmenopausal/atrophic genital changes. No significant hot flushes, night sweats, vaginal dryness or any bleeding. Continue to monitor report any issues or bleeding. 2. Mons pubis itching. Historically consistent with yeast. No overt abnormalities on exam. Will treat with Mycolog cream  intermittently as needed. Assuming she clears with use then she will use intermittently if recurs. She will follow up if symptoms persist despite this treatment. 3. Osteopenia. 4. Mammography due now patient knows to schedule. SBE monthly reviewed. 5. Colonoscopy 2016. Repeat at their recommended interval. 6. Pap smear 2016. No Pap smear done today. History of dysplasia 1988 with cryosurgery with normal Pap smears afterwards. 7. Health maintenance. No routine lab work done as patient reports this done elsewhere. Follow up 1 year, sooner as needed.   Anastasio Auerbach MD, 2:21 PM 08/12/2016

## 2016-08-19 ENCOUNTER — Encounter (HOSPITAL_BASED_OUTPATIENT_CLINIC_OR_DEPARTMENT_OTHER): Payer: Self-pay

## 2016-08-19 ENCOUNTER — Emergency Department (HOSPITAL_BASED_OUTPATIENT_CLINIC_OR_DEPARTMENT_OTHER)
Admission: EM | Admit: 2016-08-19 | Discharge: 2016-08-19 | Disposition: A | Discharge status: Home / Self Care | Payer: Medicare Other | Attending: Emergency Medicine | Admitting: Emergency Medicine

## 2016-08-19 DIAGNOSIS — I10 Essential (primary) hypertension: Secondary | ICD-10-CM | POA: Diagnosis not present

## 2016-08-19 DIAGNOSIS — Z87891 Personal history of nicotine dependence: Secondary | ICD-10-CM | POA: Diagnosis not present

## 2016-08-19 DIAGNOSIS — M79604 Pain in right leg: Secondary | ICD-10-CM | POA: Diagnosis present

## 2016-08-19 DIAGNOSIS — Z79899 Other long term (current) drug therapy: Secondary | ICD-10-CM | POA: Diagnosis not present

## 2016-08-19 DIAGNOSIS — L03115 Cellulitis of right lower limb: Secondary | ICD-10-CM | POA: Diagnosis not present

## 2016-08-19 DIAGNOSIS — Z7982 Long term (current) use of aspirin: Secondary | ICD-10-CM | POA: Insufficient documentation

## 2016-08-19 DIAGNOSIS — R21 Rash and other nonspecific skin eruption: Secondary | ICD-10-CM | POA: Insufficient documentation

## 2016-08-19 MED ORDER — CEPHALEXIN 500 MG PO CAPS: 500.0000 mg | ORAL_CAPSULE | Freq: Four times a day (QID) | ORAL | 0 refills | Status: DC

## 2016-08-19 NOTE — ED Triage Notes (Signed)
Pt states she is here for recheck of ?insect bites to right UE and right LE-NAD-steady gait

## 2016-08-19 NOTE — ED Provider Notes (Signed)
[facility] Provider Note   CSN: [id number] Arrival date & time: 08/19/16  1122     History   Chief Complaint Chief Complaint  Patient presents with  . Wound Check    HPI Erika Russell is a 71 y.o. female.  Patient is a 71 year old female who presents for evaluation of rash. She was seen here one week ago and was told she may have insect bites. She was given cortisone cream which has helped very little. She has an erythematous, warm, tender area to the anterior mid lower leg. She also has an itchy area to the lateral aspect of the right wrist. She denies any fevers or chills. She denies any difficulty breathing.   The history is provided by the patient.  Wound Check  This is a new problem. Episode onset: 1 week ago. The problem occurs constantly. The problem has been gradually worsening. Nothing aggravates the symptoms. Nothing relieves the symptoms. Treatments tried: Cortisone cream. The treatment provided mild relief.    Past Medical History:  Diagnosis Date  . Cervical dysplasia 1988  . Colitis    collagenous  . Elevated cholesterol   . Heart block   . Hypertension   . IBS (irritable bowel syndrome)   . Osteopenia 09/2015   T score -1.8 FRAX 11%/2.8%    Patient Active Problem List   Diagnosis Date Noted  . Elevated cholesterol   . Heart block   . CIN (conjunctival intraepithelial neoplasia)     Past Surgical History:  Procedure Laterality Date  . APPENDECTOMY    . CARDIAC STINT  09  . GYNECOLOGIC CRYOSURGERY      OB History    Gravida Para Term Preterm AB Living   3 2 2   1 2    SAB TAB Ectopic Multiple Live Births   1               Home Medications    Prior to Admission medications   Medication Sig Start Date End Date Taking? Authorizing Provider  amLODipine (NORVASC) 5 MG tablet Take 5 mg by mouth daily.    Historical Provider, MD  aspirin 81 MG tablet Take 81 mg by mouth daily.    Historical Provider, MD  Atorvastatin Calcium  (LIPITOR PO) Take by mouth.      Historical Provider, MD  Calcium Carbonate-Vitamin D (CALCIUM + D PO) Take by mouth.      Historical Provider, MD  cephALEXin (KEFLEX) 500 MG capsule Take 1 capsule (500 mg total) by mouth 4 (four) times daily. 12/06/15   Shary Decamp, PA-C  cetirizine (ZYRTEC) 10 MG tablet Take 10 mg by mouth daily.      Historical Provider, MD  Cholecalciferol (VITAMIN D) 400 UNITS capsule Take 400 Units by mouth daily.      Historical Provider, MD  Dexlansoprazole (DEXILANT PO) Take by mouth.    Historical Provider, MD  fluticasone Asencion Islam) 50 MCG/ACT nasal spray 1 spray each nares bid 10/06/13   Tanna Furry, MD  GLYCOPYRROLATE PO Take by mouth.    Historical Provider, MD  hydrocortisone 2.5 % cream Apply topically 2 (two) times daily. 08/12/16   Waynetta Pean, PA-C  metoprolol succinate (TOPROL XL) 25 MG 24 hr tablet Take 25 mg by mouth daily.    Historical Provider, MD  Multiple Vitamin (MULTIVITAMIN) tablet Take 1 tablet by mouth daily.      Historical Provider, MD  nystatin-triamcinolone ointment (MYCOLOG) Apply 1 application topically 2 (two) times daily. 08/12/16  Anastasio Auerbach, MD  olmesartan (BENICAR) 40 MG tablet Take 40 mg by mouth daily.    Historical Provider, MD  Probiotic Product (ALIGN) 4 MG CAPS Take by mouth.    Historical Provider, MD    Family History Family History  Problem Relation Age of Onset  . Cancer Father     COLON  . Diabetes Father   . Breast cancer Sister     Age 74's    Social History Social History  Substance Use Topics  . Smoking status: Former Research scientist (life sciences)  . Smokeless tobacco: Never Used  . Alcohol use 4.2 oz/week    7 Standard drinks or equivalent per week     Comment: red wine x 1 nightly     Allergies   Codeine   Review of Systems Review of Systems  All other systems reviewed and are negative.    Physical Exam Updated Vital Signs BP (!) 178/76 (BP Location: Left Arm)   Pulse 90   Temp 98.4 F (36.9 C) (Oral)   Resp  18   Ht 5\' 2"  (1.575 m)   Wt 145 lb (65.8 kg)   SpO2 100%   BMI 26.52 kg/m   Physical Exam  Constitutional: She is oriented to person, place, and time. She appears well-developed and well-nourished. No distress.  HENT:  Head: Normocephalic and atraumatic.  Neck: Normal range of motion. Neck supple.  Neurological: She is alert and oriented to person, place, and time.  Skin: Skin is warm and dry. She is not diaphoretic.  There is a 5 cm x 8 cm area of warmth, erythema, and tenderness to the anterior aspect of the right mid lower leg.  There is also a slight erythematous, urticarial rash to the lateral aspect of the right wrist.  Nursing note and vitals reviewed.    ED Treatments / Results  Labs (all labs ordered are listed, but only abnormal results are displayed) Labs Reviewed - No data to display  EKG  EKG Interpretation None       Radiology No results found.  Procedures Procedures (including critical care time)  Medications Ordered in ED Medications - No data to display   Initial Impression / Assessment and Plan / ED Course  I have reviewed the triage vital signs and the nursing notes.  Pertinent labs & imaging results that were available during my care of the patient were reviewed by me and considered in my medical decision making (see chart for details).  This appears to be a cellulitis of the leg and the lesions to the right wrist appear to be more allergic in nature. She will be treated with Keflex and advised to take Benadryl in addition to the cortisone cream.  Final Clinical Impressions(s) / ED Diagnoses   Final diagnoses:  None    New Prescriptions New Prescriptions   No medications on file     Veryl Speak, MD 08/19/16 1201

## 2016-08-19 NOTE — Discharge Instructions (Signed)
Keflex as prescribed.  Continue using cortisone cream as prescribed. Begin taking Benadryl 25 mg every 8 hours as needed for itching.  Return to the emergency department if your symptoms significantly worsen or change.

## 2016-12-25 ENCOUNTER — Other Ambulatory Visit: Payer: Self-pay | Admitting: Internal Medicine

## 2016-12-25 DIAGNOSIS — Z1231 Encounter for screening mammogram for malignant neoplasm of breast: Secondary | ICD-10-CM

## 2016-12-30 ENCOUNTER — Ambulatory Visit
Admission: RE | Admit: 2016-12-30 | Discharge: 2016-12-30 | Disposition: A | Discharge status: Home / Self Care | Payer: Medicare Other | Source: Ambulatory Visit | Attending: Internal Medicine | Admitting: Internal Medicine

## 2016-12-30 DIAGNOSIS — Z1231 Encounter for screening mammogram for malignant neoplasm of breast: Secondary | ICD-10-CM

## 2017-03-25 ENCOUNTER — Ambulatory Visit (INDEPENDENT_AMBULATORY_CARE_PROVIDER_SITE_OTHER): Payer: Medicare Other | Admitting: Gynecology

## 2017-03-25 ENCOUNTER — Encounter: Payer: Self-pay | Admitting: Gynecology

## 2017-03-25 VITALS — BP 120/74

## 2017-03-25 DIAGNOSIS — N644 Mastodynia: Secondary | ICD-10-CM | POA: Diagnosis not present

## 2017-03-25 NOTE — Patient Instructions (Signed)
Call if the breast tenderness persists.

## 2017-03-25 NOTE — Progress Notes (Signed)
    Erika Russell Apr 11, 1946 [id number]        71 y.o.  [id number] presents with several days of right breast tenderness.  Patient notes upper mid breast is tender to the touch.  Does not feel any masses or other changes.  Does not remember any trauma or other attributable causes.  No history of same before.  Had 3D mammography which was normal 12/2016.  Past medical history,surgical history, problem list, medications, allergies, family history and social history were all reviewed and documented in the EPIC chart.  Directed ROS with pertinent positives and negatives documented in the history of present illness/assessment and plan.  Exam: Caryn Bee assistant Vitals:   03/25/17 1114  BP: 120/74   General appearance:  Normal Both breasts examined lying and sitting without masses retractions discharge or adenopathy.  The area of the patient's pointing to is the 12 o'clock position right breast 2 fingerbreadths off the areola  Assessment/Plan:  71 y.o. [id number] with new onset right breast tenderness with normal exam.  Differential reviewed to include possible trauma perhaps while sleeping, hormonal, small nonpalpable cysts or fibroglandular changes and pre-/malignancy.  Given the short duration, normal physical exam, recent 3D mammography unlikely significant pathology although reviewed with the patient possible.  Options now would be observation with heat to the area and follow-up if continues or certainly feels anything versus proceeding with ultrasound/diagnostic mammography at this time.  Patient is comfortable with observation and heat and will call me if there is any persistence of her tenderness over the next several weeks or if she feels anything abnormal and we will go to the next level as far as evaluation.  Patient is comfortable with this plan.  Greater than 50% of my time was spent in direct face to face counseling and coordination of care with the patient.     Anastasio Auerbach  MD, 11:31 AM 03/25/2017

## 2017-06-05 ENCOUNTER — Emergency Department (HOSPITAL_BASED_OUTPATIENT_CLINIC_OR_DEPARTMENT_OTHER)
Admission: EM | Admit: 2017-06-05 | Discharge: 2017-06-05 | Disposition: A | Discharge status: Home / Self Care | Payer: Medicare Other | Attending: Emergency Medicine | Admitting: Emergency Medicine

## 2017-06-05 ENCOUNTER — Encounter (HOSPITAL_BASED_OUTPATIENT_CLINIC_OR_DEPARTMENT_OTHER): Payer: Self-pay | Admitting: Emergency Medicine

## 2017-06-05 ENCOUNTER — Emergency Department (HOSPITAL_BASED_OUTPATIENT_CLINIC_OR_DEPARTMENT_OTHER): Payer: Medicare Other

## 2017-06-05 ENCOUNTER — Other Ambulatory Visit: Payer: Self-pay

## 2017-06-05 DIAGNOSIS — R1031 Right lower quadrant pain: Secondary | ICD-10-CM | POA: Diagnosis not present

## 2017-06-05 DIAGNOSIS — R110 Nausea: Secondary | ICD-10-CM | POA: Diagnosis not present

## 2017-06-05 DIAGNOSIS — R109 Unspecified abdominal pain: Secondary | ICD-10-CM | POA: Diagnosis present

## 2017-06-05 DIAGNOSIS — R3915 Urgency of urination: Secondary | ICD-10-CM | POA: Insufficient documentation

## 2017-06-05 DIAGNOSIS — Z79899 Other long term (current) drug therapy: Secondary | ICD-10-CM | POA: Diagnosis not present

## 2017-06-05 DIAGNOSIS — Z87891 Personal history of nicotine dependence: Secondary | ICD-10-CM | POA: Diagnosis not present

## 2017-06-05 DIAGNOSIS — I10 Essential (primary) hypertension: Secondary | ICD-10-CM | POA: Insufficient documentation

## 2017-06-05 DIAGNOSIS — R350 Frequency of micturition: Secondary | ICD-10-CM | POA: Diagnosis not present

## 2017-06-05 DIAGNOSIS — Z7982 Long term (current) use of aspirin: Secondary | ICD-10-CM | POA: Insufficient documentation

## 2017-06-05 LAB — CBC WITH DIFFERENTIAL/PLATELET
Basophils Absolute: 0 10*3/uL (ref 0.0–0.1)
Basophils Relative: 0 %
EOS ABS: 0 10*3/uL (ref 0.0–0.7)
EOS PCT: 0 %
HCT: 32.6 % — ABNORMAL LOW (ref 36.0–46.0)
Hemoglobin: 10.8 g/dL — ABNORMAL LOW (ref 12.0–15.0)
LYMPHS ABS: 1.6 10*3/uL (ref 0.7–4.0)
Lymphocytes Relative: 19 %
MCH: 21.4 pg — ABNORMAL LOW (ref 26.0–34.0)
MCHC: 33.1 g/dL (ref 30.0–36.0)
MCV: 64.7 fL — AB (ref 78.0–100.0)
MONO ABS: 0.8 10*3/uL (ref 0.1–1.0)
Monocytes Relative: 9 %
Neutro Abs: 6.2 10*3/uL (ref 1.7–7.7)
Neutrophils Relative %: 72 %
PLATELETS: 240 10*3/uL (ref 150–400)
RBC: 5.04 MIL/uL (ref 3.87–5.11)
RDW: 15.6 % — ABNORMAL HIGH (ref 11.5–15.5)
WBC: 8.6 10*3/uL (ref 4.0–10.5)

## 2017-06-05 LAB — COMPREHENSIVE METABOLIC PANEL
ALT: 16 U/L (ref 14–54)
AST: 25 U/L (ref 15–41)
Albumin: 3.8 g/dL (ref 3.5–5.0)
Alkaline Phosphatase: 84 U/L (ref 38–126)
Anion gap: 8 (ref 5–15)
BUN: 9 mg/dL (ref 6–20)
CHLORIDE: 103 mmol/L (ref 101–111)
CO2: 26 mmol/L (ref 22–32)
CREATININE: 0.75 mg/dL (ref 0.44–1.00)
Calcium: 8.7 mg/dL — ABNORMAL LOW (ref 8.9–10.3)
GFR calc non Af Amer: >60 mL/min (ref >60)
Glucose, Bld: 102 mg/dL — ABNORMAL HIGH (ref 65–99)
Potassium: 2.8 mmol/L — ABNORMAL LOW (ref 3.5–5.1)
SODIUM: 137 mmol/L (ref 135–145)
Total Bilirubin: 0.5 mg/dL (ref 0.3–1.2)
Total Protein: 7 g/dL (ref 6.5–8.1)

## 2017-06-05 LAB — URINALYSIS, MICROSCOPIC (REFLEX)

## 2017-06-05 LAB — URINALYSIS, ROUTINE W REFLEX MICROSCOPIC
BILIRUBIN URINE: NEGATIVE
Glucose, UA: NEGATIVE mg/dL
KETONES UR: 15 mg/dL — AB
Leukocytes, UA: NEGATIVE
Nitrite: NEGATIVE
Protein, ur: NEGATIVE mg/dL
SPECIFIC GRAVITY, URINE: 1.02 (ref 1.005–1.030)
pH: 6 (ref 5.0–8.0)

## 2017-06-05 MED ORDER — CEPHALEXIN 500 MG PO CAPS: 500.0000 mg | ORAL_CAPSULE | Freq: Three times a day (TID) | ORAL | 0 refills | Status: DC

## 2017-06-05 MED ORDER — POTASSIUM CHLORIDE CRYS ER 20 MEQ PO TBCR: 40.0000 meq | EXTENDED_RELEASE_TABLET | Freq: Every day | ORAL | 0 refills | Status: AC

## 2017-06-05 MED ORDER — TRAMADOL HCL 50 MG PO TABS: 50.0000 mg | ORAL_TABLET | Freq: Four times a day (QID) | ORAL | 0 refills | Status: DC | PRN

## 2017-06-05 MED ORDER — POTASSIUM CHLORIDE CRYS ER 20 MEQ PO TBCR
40.0000 meq | EXTENDED_RELEASE_TABLET | Freq: Once | ORAL | Status: AC
  Administered 2017-06-05: 40 meq via ORAL
  Filled 2017-06-05: qty 2

## 2017-06-05 MED ORDER — SODIUM CHLORIDE 0.9 % IV BOLUS (SEPSIS)
500.0000 mL | Freq: Once | INTRAVENOUS | Status: AC
  Administered 2017-06-05: 500 mL via INTRAVENOUS

## 2017-06-05 MED ORDER — MORPHINE SULFATE (PF) 2 MG/ML IV SOLN
2.0000 mg | Freq: Once | INTRAVENOUS | Status: AC
  Administered 2017-06-05: 2 mg via INTRAVENOUS

## 2017-06-05 MED ORDER — ONDANSETRON HCL 4 MG/2ML IJ SOLN
4.0000 mg | Freq: Once | INTRAMUSCULAR | Status: AC
  Administered 2017-06-05: 4 mg via INTRAVENOUS
  Filled 2017-06-05: qty 2

## 2017-06-05 MED ORDER — POTASSIUM CHLORIDE 10 MEQ/100ML IV SOLN
10.0000 meq | Freq: Once | INTRAVENOUS | Status: AC
  Administered 2017-06-05: 10 meq via INTRAVENOUS
  Filled 2017-06-05: qty 100

## 2017-06-05 MED ORDER — MORPHINE SULFATE (PF) 4 MG/ML IV SOLN
4.0000 mg | Freq: Once | INTRAVENOUS | Status: DC
  Filled 2017-06-05: qty 1

## 2017-06-05 MED ORDER — ONDANSETRON HCL 4 MG PO TABS: 4.0000 mg | ORAL_TABLET | Freq: Three times a day (TID) | ORAL | 0 refills | Status: DC | PRN

## 2017-06-05 MED ORDER — CEFTRIAXONE SODIUM 1 G IJ SOLR
1.0000 g | Freq: Once | INTRAMUSCULAR | Status: AC
  Administered 2017-06-05: 1 g via INTRAVENOUS
  Filled 2017-06-05: qty 10

## 2017-06-05 NOTE — ED Provider Notes (Signed)
[facility]ER[facility] DEPARTMENT Provider Note   CSN: [id number] Arrival date & time: 06/05/17  1132     History   Chief Complaint Chief Complaint  Patient presents with  . Flank Pain    HPI Erika Russell is a 72 y.o. female.  HPI 72 year old African-American female past medical history significant for colitis, hypertension that presents to the emergency department today with complaints of right flank pain with associated urinary urgency and frequency.  The patient states that she woke up this morning and noticed the pain in the right side of her back.  She states that she thought it was her chronic back pain however it moved to her right lower quadrant and caused some urinary urgency and frequency.  The patient states the pain has since decreased however is still present.  She reports the pain was sharp and constant initially.  Patient reports associated nausea but denies any emesis.  Patient not taking for pain prior to arrival.  Nothing makes better or worse.  She denies any associated vaginal symptoms, change in bowel habits, fevers, chills, vomiting.  Pt denies any fever, chill, ha, vision changes, lightheadedness, dizziness, congestion, neck pain, cp, sob, cough, change in bowel habits, melena, hematochezia, lower extremity paresthesias.  Past Medical History:  Diagnosis Date  . Cervical dysplasia 1988  . Colitis    collagenous  . Elevated cholesterol   . Heart block   . Hypertension   . IBS (irritable bowel syndrome)   . Osteopenia 09/2015   T score -1.8 FRAX 11%/2.8%    Patient Active Problem List   Diagnosis Date Noted  . Elevated cholesterol   . Heart block   . CIN (conjunctival intraepithelial neoplasia)     Past Surgical History:  Procedure Laterality Date  . APPENDECTOMY    . CARDIAC STINT  09  . GYNECOLOGIC CRYOSURGERY      OB History    Gravida Para Term Preterm AB Living   3 2 2   1 2    SAB TAB Ectopic Multiple Live Births   1                Home Medications    Prior to Admission medications   Medication Sig Start Date End Date Taking? Authorizing Provider  amLODipine (NORVASC) 5 MG tablet Take 5 mg by mouth daily.   Yes [provider]  aspirin 81 MG tablet Take 81 mg by mouth daily.   Yes [provider]  Atorvastatin Calcium (LIPITOR PO) Take by mouth.     Yes [provider]  Calcium Carbonate-Vitamin D (CALCIUM + D PO) Take by mouth.     Yes [provider]  cetirizine (ZYRTEC) 10 MG tablet Take 10 mg by mouth daily.     Yes [provider]  Cholecalciferol (VITAMIN D) 400 UNITS capsule Take 400 Units by mouth daily.     Yes [provider]  metoprolol succinate (TOPROL XL) 25 MG 24 hr tablet Take 25 mg by mouth daily.   Yes [provider]  olmesartan (BENICAR) 40 MG tablet Take 40 mg by mouth daily.   Yes [provider]  cephALEXin (KEFLEX) 500 MG capsule Take 1 capsule (500 mg total) by mouth 3 (three) times daily. 06/05/17   [name], PA-C  Dexlansoprazole (DEXILANT PO) Take by mouth.    [provider]  fluticasone Asencion Islam) 50 MCG/ACT nasal spray 1 spray each nares bid 10/06/13   Tanna Furry, MD  GLYCOPYRROLATE  PO Take by mouth.    [provider]  hydrocortisone 2.5 % cream Apply topically 2 (two) times daily. 08/12/16   Waynetta Pean, PA-C  Multiple Vitamin (MULTIVITAMIN) tablet Take 1 tablet by mouth daily.      [provider]  nystatin-triamcinolone ointment (MYCOLOG) Apply 1 application topically 2 (two) times daily. 08/12/16   [name], [name], MD  ondansetron (ZOFRAN) 4 MG tablet Take 1 tablet (4 mg total) by mouth every 8 (eight) hours as needed for nausea or vomiting. 06/05/17   [name], PA-C  Probiotic Product (ALIGN) 4 MG CAPS Take by mouth.    [provider]  traMADol (ULTRAM) 50 MG tablet Take 1 tablet (50 mg total) by mouth every 6 (six) hours as needed. 06/05/17    [name], PA-C    Family History Family History  Problem Relation Age of Onset  . Cancer Father        COLON  . Diabetes Father   . Breast cancer Sister        Age 62's    Social History Social History   Tobacco Use  . Smoking status: Former Research scientist (life sciences)  . Smokeless tobacco: Never Used  Substance Use Topics  . Alcohol use: Yes    Alcohol/week: 4.2 oz    Types: 7 Standard drinks or equivalent per week    Comment: red wine x 1 nightly  . Drug use: No     Allergies   Codeine   Review of Systems Review of Systems  Constitutional: Negative for chills and fever.  HENT: Negative for congestion.   Eyes: Negative for visual disturbance.  Respiratory: Negative for cough and shortness of breath.   Cardiovascular: Negative for chest pain.  Gastrointestinal: Positive for abdominal pain and nausea. Negative for diarrhea and vomiting.  Genitourinary: Positive for dysuria, flank pain, frequency and urgency. Negative for hematuria, vaginal bleeding and vaginal discharge.  Musculoskeletal: Negative for arthralgias and myalgias.  Skin: Negative for rash.  Neurological: Negative for dizziness, syncope, weakness, light-headedness, numbness and headaches.  Psychiatric/Behavioral: Negative for sleep disturbance. The patient is not nervous/anxious.      Physical Exam Updated Vital Signs BP 136/62   Pulse 64   Temp 98.9 F (37.2 C) (Oral)   Resp 16   SpO2 99%   Physical Exam  Constitutional: She is oriented to person, place, and time. She appears well-developed and well-nourished.  Non-toxic appearance. No distress.  HENT:  Head: Normocephalic and atraumatic.  Nose: Nose normal.  Mouth/Throat: Oropharynx is clear and moist.  Eyes: Conjunctivae are normal. Pupils are equal, round, and reactive to light. Right eye exhibits no discharge. Left eye exhibits no discharge.  Neck: Normal range of motion. Neck supple.  Cardiovascular: Normal rate, regular rhythm, normal heart  sounds and intact distal pulses. Exam reveals no gallop and no friction rub.  No murmur heard. Pulmonary/Chest: Effort normal and breath sounds normal. No stridor. No respiratory distress. She has no wheezes. She has no rales. She exhibits no tenderness.  Abdominal: Soft. Bowel sounds are normal. There is tenderness in the right lower quadrant and suprapubic area. There is no rigidity, no rebound, no guarding, no CVA tenderness, no tenderness at McBurney's point and negative Murphy's sign.  Musculoskeletal: Normal range of motion. She exhibits no tenderness.  Lymphadenopathy:    She has no cervical adenopathy.  Neurological: She is alert and oriented to person, place, and time.  Skin: Skin is warm and dry. Capillary refill takes less than  2 seconds.  Psychiatric: Her behavior is normal. Judgment and thought content normal.  Nursing note and vitals reviewed.    ED Treatments / Results  Labs (all labs ordered are listed, but only abnormal results are displayed) Labs Reviewed  URINALYSIS, ROUTINE W REFLEX MICROSCOPIC - Abnormal; Notable for the following components:      Result Value   Hgb urine dipstick LARGE (*)    Ketones, ur 15 (*)    All other components within normal limits  URINALYSIS, MICROSCOPIC (REFLEX) - Abnormal; Notable for the following components:   Bacteria, UA MANY (*)    Squamous Epithelial / LPF 0-5 (*)    All other components within normal limits  CBC WITH DIFFERENTIAL/PLATELET - Abnormal; Notable for the following components:   Hemoglobin 10.8 (*)    HCT 32.6 (*)    MCV 64.7 (*)    MCH 21.4 (*)    RDW 15.6 (*)    All other components within normal limits  COMPREHENSIVE METABOLIC PANEL - Abnormal; Notable for the following components:   Potassium 2.8 (*)    Glucose, Bld 102 (*)    Calcium 8.7 (*)    All other components within normal limits    EKG  EKG Interpretation None       Radiology Ct Renal Stone Study  Result Date: 06/05/2017 CLINICAL DATA:   Lower abdominal and pelvic pain with right flank pain and hematuria. EXAM: CT ABDOMEN AND PELVIS WITHOUT CONTRAST TECHNIQUE: Multidetector CT imaging of the abdomen and pelvis was performed following the standard protocol without IV contrast. COMPARISON:  08/26/2014 FINDINGS: Lower chest:  Unremarkable. Hepatobiliary: No focal abnormality in the liver on this study without intravenous contrast. There is no evidence for gallstones, gallbladder wall thickening, or pericholecystic fluid. No intrahepatic or extrahepatic biliary dilation. Pancreas: No focal mass lesion. No dilatation of the main duct. No intraparenchymal cyst. No peripancreatic edema. Spleen: No splenomegaly. No focal mass lesion. Adrenals/Urinary Tract: No adrenal nodule or mass. 4 x 7 mm nonobstructing stone is identified in the lower pole of the right kidney. No right ureteral stone. No secondary changes in the right kidney or ureter. No stones are seen in the left kidney or ureter. No bladder stones. Stomach/Bowel: Stomach is nondistended. No gastric wall thickening. No evidence of outlet obstruction. Duodenum is normally positioned as is the ligament of Treitz. No small bowel wall thickening. No small bowel dilatation. The terminal ileum is normal. Nonvisualization of the appendix is consistent with the reported history of appendectomy. There appears to be mild edema and wall thickening in the right colon although this segment of colon is not well distended which can sometimes lead to spur ES appearance of wall thickening. Probable associated mild pericolonic edema suggests that this is a real finding. Vascular/Lymphatic: There is abdominal aortic atherosclerosis without aneurysm. There is no gastrohepatic or hepatoduodenal ligament lymphadenopathy. No intraperitoneal or retroperitoneal lymphadenopathy. No pelvic sidewall lymphadenopathy. Reproductive: Calcified fibroids noted.  There is no adnexal mass. Other: No intraperitoneal free fluid.  Musculoskeletal: Degenerative changes are noted in both hips. Bone windows reveal no worrisome lytic or sclerotic osseous lesions. IMPRESSION: 1. Right nephrolithiasis without secondary changes in the right kidney or ureter. 2. Apparent mild wall thickening and edema in the right colon with subtle pericolonic edema/inflammation. Although this colon is not well distended, infectious or inflammatory colitis would be a consideration. There appears to be some prominent fat in the wall of the right colon which suggest chronic inflammation. 3.  Aortic Atherosclerois (ICD10-170.0)  4. Calcified uterine fibroids. 5. Bilateral hip osteoarthritis Electronically Signed   By: Misty Stanley M.D.   On: 06/05/2017 15:23    Procedures Procedures (including critical care time)  Medications Ordered in ED Medications  potassium chloride SA (K-DUR,KLOR-CON) CR tablet 40 mEq (40 mEq Oral Given 06/05/17 1524)  potassium chloride 10 mEq in 100 mL IVPB (0 mEq Intravenous Stopped 06/05/17 1623)  ondansetron (ZOFRAN) injection 4 mg (4 mg Intravenous Given 06/05/17 1514)  sodium chloride 0.9 % bolus 500 mL (0 mLs Intravenous Stopped 06/05/17 1624)  morphine 2 MG/ML injection 2 mg (2 mg Intravenous Given 06/05/17 1516)  cefTRIAXone (ROCEPHIN) 1 g in dextrose 5 % 50 mL IVPB (1 g Intravenous New Bag/Given 06/05/17 1625)     Initial Impression / Assessment and Plan / ED Course  I have reviewed the triage vital signs and the nursing notes.  Pertinent labs & imaging results that were available during my care of the patient were reviewed by me and considered in my medical decision making (see chart for details).     Patient presents to the emergency department today for evaluation of right flank pain and right lower quadrant abdominal pain with associated urinary symptoms.  Acute onset this morning.  Patient reports associated nausea but denies any fevers, chills, change in bowel habits, vomiting.  The patient's vital signs are  reassuring.  Patient is afebrile in the ED.  No tachycardia or hypotension noted.  Lungs clear to auscultation bilaterally.  Regular rate and rhythm with no rubs murmurs or gallops.  No CVA tenderness.  Mild right lower quadrant tenderness noted.  She has no signs of peritonitis.   No leukocytosis noted.  Hemoglobin appears at patient's baseline.  Patient does have a potassium of 2.8 however history of same.  Patient is on chronic potassium supplementation has not been taking for the past few days.  Patient's kidney function is normal.  Normal liver enzymes.  UA with RBCs and many bacteria.  Negative nitrites and no leuks.   CT renal stone was ordered to rule out ureteral stone versus pyelonephritis.  IMPRESSION: 1. Right nephrolithiasis without secondary changes in the right kidney or ureter. 2. Apparent mild wall thickening and edema in the right colon with subtle pericolonic edema/inflammation. Although this colon is not well distended, infectious or inflammatory colitis would be a consideration. There appears to be some prominent fat in the wall of the right colon which suggest a chronicinflammation. 3.  Aortic Atherosclerois (ICD10-170.0) 4. Calcified uterine fibroids. 5. Bilateral hip osteoarthritis   Patient does have a chronic colitis and is followed by GI.  CT findings seem chronic in nature however instructed patient to follow-up with a GI doctor.  She has no complaint of diarrhea, constipation or blood in stool.  She has no signs of obstructing ureteral stone.  Differential diagnosis includes recently passed stone versus UTI/Pyelo.   Patient treated with IV Rocephin in the ED.  She was given dose of IV potassium along with oral potassium.  Encouraged to take an additional dose of potassium at home this evening and have follow-up next week for repeat potassium.  Will discharge home on Keflex for UTI treatment and will give a short course of pain medicine and nausea medicine.  Have given  patient very strict return precautions.  Pt is hemodynamically stable, in NAD, & able to ambulate in the ED. Evaluation does not show pathology that would require ongoing emergent intervention or inpatient treatment. I explained the diagnosis to the  patient. Pain has been managed & has no complaints prior to dc. Pt is comfortable with above plan and is stable for discharge at this time. All questions were answered prior to disposition. Strict return precautions for f/u to the ED were discussed. Encouraged follow up with PCP.  Patient was seen and evaluated by my attending who is agreed with the above plan.   Final Clinical Impressions(s) / ED Diagnoses   Final diagnoses:  Right flank pain    ED Discharge Orders        Ordered    traMADol (ULTRAM) 50 MG tablet  Every 6 hours PRN     06/05/17 1649    ondansetron (ZOFRAN) 4 MG tablet  Every 8 hours PRN     06/05/17 1649    cephALEXin (KEFLEX) 500 MG capsule  3 times daily     06/05/17 1649       [name], PA-C 06/05/17 1708    Quintella Reichert, MD 06/08/17 1815

## 2017-06-05 NOTE — Discharge Instructions (Signed)
Your urine does show signs of infection.  Do not see any signs of an obstructing kidney stone.  Have discussed your CT findings with some inflammation of your bowel.  This needs to be followed up with your GI doctor.  Please take the Keflex as prescribed for the urinary tract infection.  Have given you a short course of pain medicine that she may take for pain that is not controlled by Tylenol or ibuprofen.  This medication will make you drowsy so do not drive with it.  Have also given you Zofran for any nausea.  Follow-up with your primary care doctor within 1 week.  Return to the ED with any worsening symptoms.  Your potassium was also low.  Make sure that you take an extra dose of potassium this evening and have your potassium rechecked by her primary care doctor next week.

## 2017-06-05 NOTE — ED Triage Notes (Signed)
R flank pain with nausea since this morning.  

## 2017-06-05 NOTE — ED Notes (Signed)
NAD at this time. Pt is stable and going home.  

## 2017-06-05 NOTE — ED Notes (Signed)
Patient transported to CT 

## 2017-08-13 ENCOUNTER — Encounter: Payer: Self-pay | Admitting: Gynecology

## 2017-08-13 ENCOUNTER — Ambulatory Visit (INDEPENDENT_AMBULATORY_CARE_PROVIDER_SITE_OTHER): Payer: Medicare Other | Admitting: Gynecology

## 2017-08-13 VITALS — BP 118/76 | Ht 62.0 in | Wt 144.0 lb

## 2017-08-13 DIAGNOSIS — M8580 Other specified disorders of bone density and structure, unspecified site: Secondary | ICD-10-CM

## 2017-08-13 DIAGNOSIS — Z9189 Other specified personal risk factors, not elsewhere classified: Secondary | ICD-10-CM | POA: Diagnosis not present

## 2017-08-13 DIAGNOSIS — Z01411 Encounter for gynecological examination (general) (routine) with abnormal findings: Secondary | ICD-10-CM

## 2017-08-13 DIAGNOSIS — Z779 Other contact with and (suspected) exposures hazardous to health: Secondary | ICD-10-CM | POA: Diagnosis not present

## 2017-08-13 DIAGNOSIS — N952 Postmenopausal atrophic vaginitis: Secondary | ICD-10-CM

## 2017-08-13 DIAGNOSIS — D259 Leiomyoma of uterus, unspecified: Secondary | ICD-10-CM

## 2017-08-13 NOTE — Progress Notes (Signed)
    Erika Russell June 30, 1945 [id number]        71 y.o.  [id number] for breast and pelvic exam.  Doing well without complaints.  Past medical history,surgical history, problem list, medications, allergies, family history and social history were all reviewed and documented as reviewed in the EPIC chart.  ROS:  Performed with pertinent positives and negatives included in the history, assessment and plan.   Additional significant findings : None   Exam: Caryn Bee assistant Vitals:   08/13/17 1356  BP: 118/76  Weight: 144 lb (65.3 kg)  Height: 5\' 2"  (1.575 m)   Body mass index is 26.34 kg/m.  General appearance:  Normal affect, orientation and appearance. Skin: Grossly normal HEENT: Without gross lesions.  No cervical or supraclavicular adenopathy. Thyroid normal.  Lungs:  Clear without wheezing, rales or rhonchi Cardiac: RR, without RMG Abdominal:  Soft, nontender, without masses, guarding, rebound, organomegaly or hernia Breasts:  Examined lying and sitting without masses, retractions, discharge or axillary adenopathy. Pelvic:  Ext, BUS, Vagina: With atrophic changes  Cervix: With atrophic changes  Uterus: Anteverted, normal size, shape and contour, midline and mobile nontender   Adnexa: Without masses or tenderness    Anus and perineum: Normal   Rectovaginal: Normal sphincter tone without palpated masses or tenderness.    Assessment/Plan:  72 y.o. [id number] female for breast and pelvic exam.   1. Postmenopausal/atrophic genital changes.  No significant hot flushes, night sweats, vaginal dryness or any vaginal bleeding.  Report any issues or bleeding. 2. CT scan for abdominal pain showed leiomyoma.  Patient had questions about that.  We reviewed leiomyoma.  Uterus palpates normal size.  Will follow with annual exams.  Patient will report any pelvic discomfort or bleeding issues. 3. Mammography 12/2016.  Continue with annual mammography when due.  Breast exam normal  today. 4. Osteopenia.  DEXA 09/2015 T score -1.7 FRAX 11% / 2.8%.  Options to repeat now versus waiting another year discussed and patient prefers to wait till next year to repeat. 5. Colonoscopy 2019.  Repeat at their recommended interval. 6. Pap smear 2016.  No Pap smear done today.  History of dysplasia in 1988 with cryosurgery with normal Pap smears since.  Options to stop screening per current screening guidelines based on age reviewed versus less frequent screening intervals.  At this point the patient is comfortable with stop screening. 7. Health maintenance.  No routine lab work done as patient does this elsewhere.  Follow-up 1 year, sooner as needed.   Anastasio Auerbach MD, 2:25 PM 08/13/2017

## 2017-08-13 NOTE — Patient Instructions (Signed)
Follow-up in 1 year for annual exam 

## 2017-12-06 ENCOUNTER — Other Ambulatory Visit: Payer: Self-pay

## 2017-12-06 ENCOUNTER — Emergency Department (HOSPITAL_BASED_OUTPATIENT_CLINIC_OR_DEPARTMENT_OTHER)
Admission: EM | Admit: 2017-12-06 | Discharge: 2017-12-06 | Disposition: A | Discharge status: Home / Self Care | Payer: Medicare Other | Attending: Emergency Medicine | Admitting: Emergency Medicine

## 2017-12-06 ENCOUNTER — Encounter (HOSPITAL_BASED_OUTPATIENT_CLINIC_OR_DEPARTMENT_OTHER): Payer: Self-pay | Admitting: *Deleted

## 2017-12-06 ENCOUNTER — Emergency Department (HOSPITAL_BASED_OUTPATIENT_CLINIC_OR_DEPARTMENT_OTHER): Payer: Medicare Other

## 2017-12-06 DIAGNOSIS — Z7982 Long term (current) use of aspirin: Secondary | ICD-10-CM | POA: Diagnosis not present

## 2017-12-06 DIAGNOSIS — I10 Essential (primary) hypertension: Secondary | ICD-10-CM | POA: Insufficient documentation

## 2017-12-06 DIAGNOSIS — F17210 Nicotine dependence, cigarettes, uncomplicated: Secondary | ICD-10-CM | POA: Diagnosis not present

## 2017-12-06 DIAGNOSIS — R1011 Right upper quadrant pain: Secondary | ICD-10-CM | POA: Diagnosis present

## 2017-12-06 DIAGNOSIS — Z79899 Other long term (current) drug therapy: Secondary | ICD-10-CM | POA: Diagnosis not present

## 2017-12-06 DIAGNOSIS — N390 Urinary tract infection, site not specified: Secondary | ICD-10-CM | POA: Insufficient documentation

## 2017-12-06 DIAGNOSIS — N201 Calculus of ureter: Secondary | ICD-10-CM | POA: Diagnosis not present

## 2017-12-06 DIAGNOSIS — R319 Hematuria, unspecified: Secondary | ICD-10-CM

## 2017-12-06 LAB — CBC WITH DIFFERENTIAL/PLATELET
Basophils Absolute: 0 10*3/uL (ref 0.0–0.1)
Basophils Relative: 0 %
EOS PCT: 0 %
Eosinophils Absolute: 0 10*3/uL (ref 0.0–0.7)
HEMATOCRIT: 33.4 % — AB (ref 36.0–46.0)
Hemoglobin: 11.3 g/dL — ABNORMAL LOW (ref 12.0–15.0)
LYMPHS PCT: 35 %
Lymphs Abs: 2.9 10*3/uL (ref 0.7–4.0)
MCH: 21.1 pg — AB (ref 26.0–34.0)
MCHC: 33.8 g/dL (ref 30.0–36.0)
MCV: 62.4 fL — AB (ref 78.0–100.0)
Monocytes Absolute: 0.7 10*3/uL (ref 0.1–1.0)
Monocytes Relative: 8 %
NEUTROS ABS: 4.8 10*3/uL (ref 1.7–7.7)
Neutrophils Relative %: 57 %
PLATELETS: 211 10*3/uL (ref 150–400)
RBC: 5.35 MIL/uL — AB (ref 3.87–5.11)
RDW: 16.3 % — ABNORMAL HIGH (ref 11.5–15.5)
WBC: 8.4 10*3/uL (ref 4.0–10.5)

## 2017-12-06 LAB — COMPREHENSIVE METABOLIC PANEL
ALBUMIN: 4.1 g/dL (ref 3.5–5.0)
ALT: 16 U/L (ref 0–44)
AST: 27 U/L (ref 15–41)
Alkaline Phosphatase: 84 U/L (ref 38–126)
Anion gap: 7 (ref 5–15)
BUN: 12 mg/dL (ref 8–23)
CHLORIDE: 107 mmol/L (ref 98–111)
CO2: 23 mmol/L (ref 22–32)
CREATININE: 0.78 mg/dL (ref 0.44–1.00)
Calcium: 8.8 mg/dL — ABNORMAL LOW (ref 8.9–10.3)
GFR calc Af Amer: >60 mL/min (ref >60)
GFR calc non Af Amer: >60 mL/min (ref >60)
GLUCOSE: 116 mg/dL — AB (ref 70–99)
Potassium: 3.5 mmol/L (ref 3.5–5.1)
SODIUM: 137 mmol/L (ref 135–145)
Total Bilirubin: 0.4 mg/dL (ref 0.3–1.2)
Total Protein: 7.4 g/dL (ref 6.5–8.1)

## 2017-12-06 LAB — URINALYSIS, ROUTINE W REFLEX MICROSCOPIC
Bilirubin Urine: NEGATIVE
Glucose, UA: NEGATIVE mg/dL
Ketones, ur: 15 mg/dL — AB
Nitrite: POSITIVE — AB
SPECIFIC GRAVITY, URINE: 1.025 (ref 1.005–1.030)
pH: 6.5 (ref 5.0–8.0)

## 2017-12-06 LAB — URINALYSIS, MICROSCOPIC (REFLEX): RBC / HPF: >50 RBC/hpf (ref 0–5)

## 2017-12-06 MED ORDER — ONDANSETRON HCL 4 MG/2ML IJ SOLN
4.0000 mg | Freq: Once | INTRAMUSCULAR | Status: AC
  Administered 2017-12-06: 4 mg via INTRAVENOUS
  Filled 2017-12-06: qty 2

## 2017-12-06 MED ORDER — ONDANSETRON 4 MG PO TBDP: 4.0000 mg | ORAL_TABLET | Freq: Three times a day (TID) | ORAL | 0 refills | Status: DC | PRN

## 2017-12-06 MED ORDER — SODIUM CHLORIDE 0.9 % IV SOLN
1.0000 g | Freq: Once | INTRAVENOUS | Status: AC
  Administered 2017-12-06: 1 g via INTRAVENOUS
  Filled 2017-12-06: qty 10

## 2017-12-06 MED ORDER — OXYCODONE-ACETAMINOPHEN 5-325 MG PO TABS
1.0000 | ORAL_TABLET | Freq: Once | ORAL | Status: AC
  Administered 2017-12-06: 1 via ORAL
  Filled 2017-12-06: qty 1

## 2017-12-06 MED ORDER — MORPHINE SULFATE (PF) 4 MG/ML IV SOLN
4.0000 mg | Freq: Once | INTRAVENOUS | Status: AC
  Administered 2017-12-06: 4 mg via INTRAVENOUS
  Filled 2017-12-06: qty 1

## 2017-12-06 MED ORDER — KETOROLAC TROMETHAMINE 30 MG/ML IJ SOLN
15.0000 mg | Freq: Once | INTRAMUSCULAR | Status: AC
  Administered 2017-12-06: 15 mg via INTRAVENOUS
  Filled 2017-12-06: qty 1

## 2017-12-06 MED ORDER — FENTANYL CITRATE (PF) 100 MCG/2ML IJ SOLN
50.0000 ug | Freq: Once | INTRAMUSCULAR | Status: AC
  Administered 2017-12-06: 50 ug via INTRAVENOUS
  Filled 2017-12-06: qty 2

## 2017-12-06 MED ORDER — OXYCODONE-ACETAMINOPHEN 5-325 MG PO TABS: 1.0000 | ORAL_TABLET | ORAL | 0 refills | Status: DC | PRN

## 2017-12-06 MED ORDER — TAMSULOSIN HCL 0.4 MG PO CAPS: 0.4000 mg | ORAL_CAPSULE | Freq: Every day | ORAL | 0 refills | Status: DC

## 2017-12-06 MED ORDER — CEPHALEXIN 500 MG PO CAPS: 500.0000 mg | ORAL_CAPSULE | Freq: Two times a day (BID) | ORAL | 0 refills | Status: DC

## 2017-12-06 NOTE — ED Notes (Signed)
Consult to Urology (Dr. Alyson Ingles) @ [phone]

## 2017-12-06 NOTE — ED Notes (Signed)
ED Provider at bedside. 

## 2017-12-06 NOTE — ED Provider Notes (Signed)
[facility] EMERGENCY DEPARTMENT Provider Note   CSN: [id number] Arrival date & time: 12/06/17  1754     History   Chief Complaint Chief Complaint  Patient presents with  . Flank Pain    HPI Erika Russell is a 72 y.o. female.  72yo F w/ PMH including collagenous colitis, HTN, kidney stones who p/w R side pain. About 2 days ago, she began having R side pain that was initially nagging and mild but became worse today. It is currently severe. She has had associated nausea but no vomiting, fevers, dysuria, or change in bowel movements.  Her urine turned to dark when she got to the ED this evening.  She has a history of kidney infection along time ago but denies any history of recurrent kidney stones.  No medications prior to arrival.  The history is provided by the patient.  Flank Pain     Past Medical History:  Diagnosis Date  . Cervical dysplasia 1988  . Colitis    collagenous  . Elevated cholesterol   . Heart block   . Hypertension   . IBS (irritable bowel syndrome)   . Kidney stone   . Osteopenia 09/2015   T score -1.8 FRAX 11%/2.8%    Patient Active Problem List   Diagnosis Date Noted  . Elevated cholesterol   . Heart block   . CIN (conjunctival intraepithelial neoplasia)     Past Surgical History:  Procedure Laterality Date  . APPENDECTOMY    . CARDIAC STINT  09  . GYNECOLOGIC CRYOSURGERY       OB History    Gravida  3   Para  2   Term  2   Preterm      AB  1   Living  2     SAB  1   TAB      Ectopic      Multiple      Live Births               Home Medications    Prior to Admission medications   Medication Sig Start Date End Date Taking? Authorizing Provider  amLODipine (NORVASC) 5 MG tablet Take 5 mg by mouth daily.   Yes [provider]  aspirin 81 MG tablet Take 81 mg by mouth daily.   Yes [provider]  Atorvastatin Calcium (LIPITOR PO) Take by mouth.     Yes [provider]    cetirizine (ZYRTEC) 10 MG tablet Take 10 mg by mouth daily.     Yes [provider]  Dexlansoprazole (DEXILANT PO) Take by mouth.   Yes [provider]  GLYCOPYRROLATE PO Take by mouth.   Yes [provider]  metoprolol succinate (TOPROL XL) 25 MG 24 hr tablet Take 25 mg by mouth daily.   Yes [provider]  Multiple Vitamin (MULTIVITAMIN) tablet Take 1 tablet by mouth daily.     Yes [provider]  olmesartan (BENICAR) 40 MG tablet Take 40 mg by mouth daily.   Yes [provider]  potassium chloride SA (K-DUR,KLOR-CON) 20 MEQ tablet Take 2 tablets (40 mEq total) by mouth daily. 06/05/17  Yes Leaphart, Zack Seal, PA-C  Probiotic Product (ALIGN) 4 MG CAPS Take by mouth.   Yes [provider]  cephALEXin (KEFLEX) 500 MG capsule Take 1 capsule (500 mg total) by mouth 2 (two) times daily. 12/06/17   Little, Wenda Overland, MD  Cholecalciferol (VITAMIN D) 400 UNITS capsule Take  400 Units by mouth daily.      [provider]  fluticasone Asencion Islam) 50 MCG/ACT nasal spray 1 spray each nares bid 10/06/13   Tanna Furry, MD  hydrocortisone 2.5 % cream Apply topically 2 (two) times daily. 08/12/16   Waynetta Pean, PA-C  nystatin-triamcinolone ointment (MYCOLOG) Apply 1 application topically 2 (two) times daily. 08/12/16   [name], [name], MD  ondansetron (ZOFRAN ODT) 4 MG disintegrating tablet Take 1 tablet (4 mg total) by mouth every 8 (eight) hours as needed for nausea or vomiting. 12/06/17   Little, Wenda Overland, MD  oxyCODONE-acetaminophen (PERCOCET) 5-325 MG tablet Take 1 tablet by mouth every 4 (four) hours as needed for severe pain. 12/06/17   Little, Wenda Overland, MD  tamsulosin (FLOMAX) 0.4 MG CAPS capsule Take 1 capsule (0.4 mg total) by mouth daily. 12/06/17   Little, Wenda Overland, MD    Family History Family History  Problem Relation Age of Onset  . Cancer Father        COLON  . Diabetes Father   . Breast cancer Sister         Age 3's    Social History Social History   Tobacco Use  . Smoking status: Current Some Day Smoker  . Smokeless tobacco: Never Used  Substance Use Topics  . Alcohol use: Yes    Alcohol/week: 4.2 oz    Types: 7 Standard drinks or equivalent per week    Comment: red wine x 1 nightly  . Drug use: No     Allergies   Codeine   Review of Systems Review of Systems  Genitourinary: Positive for flank pain.   All other systems reviewed and are negative except that which was mentioned in HPI   Physical Exam Updated Vital Signs BP 130/61 (BP Location: Left Arm)   Pulse 70   Temp 98.3 F (36.8 C) (Oral)   Resp 16   Ht 5\' 3"  (1.6 m)   Wt 64.4 kg (142 lb)   SpO2 100%   BMI 25.15 kg/m   Physical Exam  Constitutional: She is oriented to person, place, and time. She appears well-developed and well-nourished.  Holding R side, uncomfortable  HENT:  Head: Normocephalic and atraumatic.  Moist mucous membranes  Eyes: Conjunctivae are normal.  Neck: Neck supple.  Cardiovascular: Normal rate, regular rhythm and normal heart sounds.  No murmur heard. Pulmonary/Chest: Effort normal and breath sounds normal.  Abdominal: Soft. Bowel sounds are normal. She exhibits no distension. There is no tenderness.  Musculoskeletal: She exhibits no edema.  No CVA tenderness  Neurological: She is alert and oriented to person, place, and time.  Fluent speech  Skin: Skin is warm and dry.  Psychiatric: She has a normal mood and affect. Judgment normal.  Nursing note and vitals reviewed.    ED Treatments / Results  Labs (all labs ordered are listed, but only abnormal results are displayed) Labs Reviewed  URINALYSIS, ROUTINE W REFLEX MICROSCOPIC - Abnormal; Notable for the following components:      Result Value   Color, Urine BROWN (*)    APPearance TURBID (*)    Hgb urine dipstick LARGE (*)    Ketones, ur 15 (*)    Protein, ur >300 (*)    Nitrite POSITIVE (*)    Leukocytes, UA TRACE  (*)    All other components within normal limits  COMPREHENSIVE METABOLIC PANEL - Abnormal; Notable for the following components:   Glucose, Bld 116 (*)    Calcium 8.8 (*)  All other components within normal limits  CBC WITH DIFFERENTIAL/PLATELET - Abnormal; Notable for the following components:   RBC 5.35 (*)    Hemoglobin 11.3 (*)    HCT 33.4 (*)    MCV 62.4 (*)    MCH 21.1 (*)    RDW 16.3 (*)    All other components within normal limits  URINALYSIS, MICROSCOPIC (REFLEX) - Abnormal; Notable for the following components:   Bacteria, UA FEW (*)    All other components within normal limits  URINE CULTURE    EKG None  Radiology Ct Renal Stone Study  Result Date: 12/06/2017 CLINICAL DATA:  RIGHT flank pain for 3 days. History of kidney stones, appendectomy. EXAM: CT ABDOMEN AND PELVIS WITHOUT CONTRAST TECHNIQUE: Multidetector CT imaging of the abdomen and pelvis was performed following the standard protocol without IV contrast. COMPARISON:  CT abdomen and pelvis June 05, 2017 FINDINGS: LOWER CHEST: Lung bases are clear. Heart size is normal, small LEFT ventricle lipoma versus fatty atrophy. Partially imaged coronary artery calcification. No pericardial effusion. HEPATOBILIARY: Gallbladder fold, fundus lateral to the liver. Normal liver. PANCREAS: Normal. SPLEEN: Normal. ADRENALS/URINARY TRACT: Kidneys are orthotopic, demonstrating normal size and morphology. LEFT renal scarring. Mild RIGHT hydronephrosis, 4 mm RIGHT UPJ calculus. Multiple RIGHT nephrolithiasis measuring to 9 mm. No LEFT nephrolithiasis or hydronephrosis. Limited assessment for renal masses by nonenhanced CT. The unopacified ureters are normal in course and caliber. Urinary bladder is partially distended and unremarkable. Normal adrenal glands. STOMACH/BOWEL: The stomach, small and large bowel are normal in course and caliber without inflammatory changes, sensitivity decreased by lack of enteric contrast. Small and large  bowel air-fluid levels. VASCULAR/LYMPHATIC: Aortoiliac vessels are normal in course and caliber. Severe calcific atherosclerosis. Lymphadenopathy by CT size criteria. REPRODUCTIVE: Multiple small calcified uterine leiomyomata. OTHER: No intraperitoneal free fluid or free air. MUSCULOSKELETAL: Non-acute. Small fat containing umbilical hernia. Thoracolumbar levoscoliosis. Severe LEFT greater than RIGHT hip osteoarthrosis with LEFT femoral head secondary avascular necrosis. Osteopenia. IMPRESSION: 1. 4 mm RIGHT ureteropelvic junction calculus resulting in mild obstructive uropathy. Numerous RIGHT nephrolithiasis measuring to 9 mm. 2. Small and large bowel air-fluid levels seen with enteritis. 3. 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 Aortic Atherosclerosis (ICD10-I70.0). Electronically Signed   By: Elon Alas M.D.   On: 12/06/2017 19:55    Procedures Procedures (including critical care time)  Medications Ordered in ED Medications  fentaNYL (SUBLIMAZE) injection 50 mcg (50 mcg Intravenous Given 12/06/17 1849)  ondansetron (ZOFRAN) injection 4 mg (4 mg Intravenous Given 12/06/17 1849)  oxyCODONE-acetaminophen (PERCOCET/ROXICET) 5-325 MG per tablet 1 tablet (1 tablet Oral Given 12/06/17 1849)  morphine 4 MG/ML injection 4 mg (4 mg Intravenous Given 12/06/17 2029)  ketorolac (TORADOL) 30 MG/ML injection 15 mg (15 mg Intravenous Given 12/06/17 2029)  cefTRIAXone (ROCEPHIN) 1 g in sodium chloride 0.9 % 100 mL IVPB (0 g Intravenous Stopped 12/06/17 2145)  oxyCODONE-acetaminophen (PERCOCET/ROXICET) 5-325 MG per tablet 1 tablet (1 tablet Oral Given 12/06/17 2301)     Initial Impression / Assessment and Plan / ED Course  I have reviewed the triage vital signs and the nursing notes.  Pertinent labs & imaging results that were available during my care of the patient were reviewed by me and considered in my medical decision making (see chart for details).     Patient  uncomfortable but nontoxic on exam, normal vital signs, afebrile.  No focal abdominal tenderness or CVA tenderness.  Urine was grossly bloody and cloudy on visual exam.  UA is nitrite positive with blood, some WBCs, and a few bacteria.  Urine culture sent.  Gave dose of ceftriaxone.  Obtained CT which showed 4 mm right UPJ stone w/ mild hydronephrosis.  Incidental finding of enteritis which is consistent with the patient's history of chronic colitis.  After receiving above medications, the patient was more comfortable on repeat exam and voiced improvement in her pain.  She was able to drink with no problems.  I did contact urology and discussed with Dr. Alyson Ingles because of the patient's abnormal UA.  He recommended outpatient follow-up with course of oral antibiotics given that she is afebrile with normal WBC count and well appearance.  I have discussed supportive measures and I have extensively reviewed return precautions.  Instructed her to follow-up with [facility] and report directly to [facility], ER if she has any worsening symptoms or new symptoms such as fever, intractable vomiting, or severe abdominal pain.  She voiced understanding and was discharged in satisfactory condition. Final Clinical Impressions(s) / ED Diagnoses   Final diagnoses:  Ureterolithiasis  Urinary tract infection with hematuria, site unspecified    ED Discharge Orders        Ordered    cephALEXin (KEFLEX) 500 MG capsule  2 times daily     12/06/17 2257    ondansetron (ZOFRAN ODT) 4 MG disintegrating tablet  Every 8 hours PRN     12/06/17 2257    tamsulosin (FLOMAX) 0.4 MG CAPS capsule  Daily     12/06/17 2257    oxyCODONE-acetaminophen (PERCOCET) 5-325 MG tablet  Every 4 hours PRN     12/06/17 2257       Little, Wenda Overland, MD 12/06/17 2332

## 2017-12-06 NOTE — ED Triage Notes (Signed)
Pt c/o right flank pain x 3 days -

## 2017-12-08 LAB — URINE CULTURE: Culture: NO GROWTH

## 2017-12-09 ENCOUNTER — Other Ambulatory Visit: Payer: Self-pay | Admitting: Urology

## 2017-12-09 NOTE — Addendum Note (Signed)
Addended by: Link Snuffer D III on: 12/09/2017 05:22 PM   Modules accepted: Orders

## 2017-12-10 ENCOUNTER — Encounter (HOSPITAL_COMMUNITY): Payer: Self-pay | Admitting: *Deleted

## 2017-12-10 ENCOUNTER — Other Ambulatory Visit: Payer: Self-pay

## 2017-12-13 ENCOUNTER — Encounter (HOSPITAL_COMMUNITY): Payer: Self-pay | Admitting: Certified Registered Nurse Anesthetist

## 2017-12-13 ENCOUNTER — Ambulatory Visit (HOSPITAL_COMMUNITY): Payer: Medicare Other | Admitting: Certified Registered Nurse Anesthetist

## 2017-12-13 ENCOUNTER — Ambulatory Visit (HOSPITAL_COMMUNITY)
Admission: RE | Admit: 2017-12-13 | Discharge: 2017-12-13 | Disposition: A | Discharge status: Home / Self Care | Payer: Medicare Other | Source: Ambulatory Visit | Attending: Urology | Admitting: Urology

## 2017-12-13 ENCOUNTER — Encounter (HOSPITAL_COMMUNITY)
Admission: RE | Disposition: A | Discharge status: Home / Self Care | Payer: Self-pay | Source: Ambulatory Visit | Attending: Urology

## 2017-12-13 ENCOUNTER — Ambulatory Visit (HOSPITAL_COMMUNITY): Payer: Medicare Other

## 2017-12-13 DIAGNOSIS — I10 Essential (primary) hypertension: Secondary | ICD-10-CM | POA: Insufficient documentation

## 2017-12-13 DIAGNOSIS — N200 Calculus of kidney: Secondary | ICD-10-CM | POA: Insufficient documentation

## 2017-12-13 DIAGNOSIS — Z7982 Long term (current) use of aspirin: Secondary | ICD-10-CM | POA: Insufficient documentation

## 2017-12-13 DIAGNOSIS — F17210 Nicotine dependence, cigarettes, uncomplicated: Secondary | ICD-10-CM | POA: Diagnosis not present

## 2017-12-13 DIAGNOSIS — Z79899 Other long term (current) drug therapy: Secondary | ICD-10-CM | POA: Insufficient documentation

## 2017-12-13 HISTORY — PX: CYSTOSCOPY/URETEROSCOPY/HOLMIUM LASER/STENT PLACEMENT: SHX6546

## 2017-12-13 SURGERY — CYSTOSCOPY/URETEROSCOPY/HOLMIUM LASER/STENT PLACEMENT
Anesthesia: General | Site: Ureter | Laterality: Right

## 2017-12-13 MED ORDER — FENTANYL CITRATE (PF) 100 MCG/2ML IJ SOLN
INTRAMUSCULAR | Status: AC
  Filled 2017-12-13: qty 2

## 2017-12-13 MED ORDER — LIDOCAINE 2% (20 MG/ML) 5 ML SYRINGE
INTRAMUSCULAR | Status: DC | PRN
  Administered 2017-12-13: 100 mg via INTRAVENOUS

## 2017-12-13 MED ORDER — SCOPOLAMINE 1 MG/3DAYS TD PT72
MEDICATED_PATCH | TRANSDERMAL | Status: DC | PRN
  Administered 2017-12-13: 1 via TRANSDERMAL

## 2017-12-13 MED ORDER — DIPHENHYDRAMINE HCL 50 MG/ML IJ SOLN
INTRAMUSCULAR | Status: AC
  Filled 2017-12-13: qty 1

## 2017-12-13 MED ORDER — DIPHENHYDRAMINE HCL 50 MG/ML IJ SOLN
INTRAMUSCULAR | Status: DC | PRN
  Administered 2017-12-13: 12.5 mg via INTRAVENOUS

## 2017-12-13 MED ORDER — EPHEDRINE SULFATE-NACL 50-0.9 MG/10ML-% IV SOSY
PREFILLED_SYRINGE | INTRAVENOUS | Status: DC | PRN
  Administered 2017-12-13: 7.5 mg via INTRAVENOUS

## 2017-12-13 MED ORDER — LACTATED RINGERS IV SOLN
INTRAVENOUS | Status: DC
  Administered 2017-12-13: 08:00:00 via INTRAVENOUS

## 2017-12-13 MED ORDER — SCOPOLAMINE 1 MG/3DAYS TD PT72
MEDICATED_PATCH | TRANSDERMAL | Status: AC
  Filled 2017-12-13: qty 1

## 2017-12-13 MED ORDER — DEXAMETHASONE SODIUM PHOSPHATE 10 MG/ML IJ SOLN
INTRAMUSCULAR | Status: AC
  Filled 2017-12-13: qty 1

## 2017-12-13 MED ORDER — LIDOCAINE 2% (20 MG/ML) 5 ML SYRINGE
INTRAMUSCULAR | Status: AC
  Filled 2017-12-13: qty 5

## 2017-12-13 MED ORDER — MIDAZOLAM HCL 2 MG/2ML IJ SOLN
INTRAMUSCULAR | Status: DC | PRN
  Administered 2017-12-13: 2 mg via INTRAVENOUS

## 2017-12-13 MED ORDER — MIDAZOLAM HCL 2 MG/2ML IJ SOLN
INTRAMUSCULAR | Status: AC
  Filled 2017-12-13: qty 2

## 2017-12-13 MED ORDER — FENTANYL CITRATE (PF) 100 MCG/2ML IJ SOLN
INTRAMUSCULAR | Status: DC | PRN
  Administered 2017-12-13 (×2): 25 ug via INTRAVENOUS
  Administered 2017-12-13: 75 ug via INTRAVENOUS

## 2017-12-13 MED ORDER — SODIUM CHLORIDE 0.9 % IR SOLN
Status: DC | PRN
  Administered 2017-12-13: 3000 mL

## 2017-12-13 MED ORDER — HYDROCODONE-ACETAMINOPHEN 5-325 MG PO TABS: 1.0000 | ORAL_TABLET | ORAL | 0 refills | Status: DC | PRN

## 2017-12-13 MED ORDER — CEFAZOLIN SODIUM-DEXTROSE 2-4 GM/100ML-% IV SOLN
2.0000 g | INTRAVENOUS | Status: AC
  Administered 2017-12-13: 2 g via INTRAVENOUS
  Filled 2017-12-13: qty 100

## 2017-12-13 MED ORDER — ONDANSETRON HCL 4 MG/2ML IJ SOLN
INTRAMUSCULAR | Status: AC
  Filled 2017-12-13: qty 2

## 2017-12-13 MED ORDER — ONDANSETRON HCL 4 MG/2ML IJ SOLN
INTRAMUSCULAR | Status: DC | PRN
  Administered 2017-12-13: 4 mg via INTRAVENOUS

## 2017-12-13 MED ORDER — 0.9 % SODIUM CHLORIDE (POUR BTL) OPTIME
TOPICAL | Status: DC | PRN
  Administered 2017-12-13: 1000 mL

## 2017-12-13 MED ORDER — DEXAMETHASONE SODIUM PHOSPHATE 4 MG/ML IJ SOLN
INTRAMUSCULAR | Status: DC | PRN
  Administered 2017-12-13: 5 mg via INTRAVENOUS

## 2017-12-13 MED ORDER — PROPOFOL 10 MG/ML IV BOLUS
INTRAVENOUS | Status: DC | PRN
  Administered 2017-12-13: 150 mg via INTRAVENOUS

## 2017-12-13 MED ORDER — IOHEXOL 300 MG/ML  SOLN
INTRAMUSCULAR | Status: DC | PRN
  Administered 2017-12-13: 10 mL via URETHRAL

## 2017-12-13 MED ORDER — FENTANYL CITRATE (PF) 100 MCG/2ML IJ SOLN: 25.0000 ug | INTRAMUSCULAR | Status: DC | PRN

## 2017-12-13 MED ORDER — PROMETHAZINE HCL 25 MG/ML IJ SOLN: 6.2500 mg | INTRAMUSCULAR | Status: DC | PRN

## 2017-12-13 SURGICAL SUPPLY — 23 items
BAG URO CATCHER STRL LF (MISCELLANEOUS) ×3 IMPLANT
BASKET LASER NITINOL 1.9FR (BASKET) IMPLANT
BASKET ZERO TIP NITINOL 2.4FR (BASKET) IMPLANT
BSKT STON RTRVL 120 1.9FR (BASKET)
BSKT STON RTRVL ZERO TP 2.4FR (BASKET)
CATH INTERMIT  6FR 70CM (CATHETERS) IMPLANT
CATH URET 5FR 28IN CONE TIP (BALLOONS)
CATH URET 5FR 70CM CONE TIP (BALLOONS) IMPLANT
CLOTH BEACON ORANGE TIMEOUT ST (SAFETY) ×3 IMPLANT
COVER FOOTSWITCH UNIV (MISCELLANEOUS) ×3 IMPLANT
EXTRACTOR STONE 1.7FRX115CM (UROLOGICAL SUPPLIES) IMPLANT
FIBER LASER FLEXIVA 365 (UROLOGICAL SUPPLIES) IMPLANT
FIBER LASER TRAC TIP (UROLOGICAL SUPPLIES) ×2 IMPLANT
GLOVE BIO SURGEON STRL SZ7.5 (GLOVE) ×3 IMPLANT
GOWN STRL REUS W/TWL XL LVL3 (GOWN DISPOSABLE) ×3 IMPLANT
GUIDEWIRE ANG ZIPWIRE 038X150 (WIRE) IMPLANT
GUIDEWIRE STR DUAL SENSOR (WIRE) ×5 IMPLANT
MANIFOLD NEPTUNE II (INSTRUMENTS) ×3 IMPLANT
PACK CYSTO (CUSTOM PROCEDURE TRAY) ×3 IMPLANT
SHEATH URETERAL 12FRX28CM (UROLOGICAL SUPPLIES) IMPLANT
SHEATH URETERAL 12FRX35CM (MISCELLANEOUS) ×2 IMPLANT
STENT URET 6FRX24 CONTOUR (STENTS) ×2 IMPLANT
TUBING UROLOGY SET (TUBING) ×3 IMPLANT

## 2017-12-13 NOTE — Anesthesia Postprocedure Evaluation (Signed)
Anesthesia Post Note  Patient: Erika Russell  Procedure(s) Performed: RIGHT URETEROSCOPY WITH RETROGRADE PYLEOGRAM/HOLMIUM LASER/STENT PLACEMENT (Right Ureter)     Patient location during evaluation: PACU Anesthesia Type: General Level of consciousness: sedated Pain management: pain level controlled Vital Signs Assessment: post-procedure vital signs reviewed and stable Respiratory status: spontaneous breathing and respiratory function stable Cardiovascular status: stable Postop Assessment: no apparent nausea or vomiting Anesthetic complications: no    Last Vitals:  Vitals:   12/13/17 1200 12/13/17 1221  BP: 123/61 (!) 143/68  Pulse: 71 60  Resp: 12 14  Temp:    SpO2: 93% 99%    Last Pain:  Vitals:   12/13/17 1221  TempSrc:   PainSc: 0-No pain                 Carmelo Reidel DANIEL

## 2017-12-13 NOTE — Anesthesia Preprocedure Evaluation (Signed)
Anesthesia Evaluation  Patient identified by MRN, date of birth, ID band Patient awake    Reviewed: Allergy & Precautions, NPO status , Patient's Chart, lab work & pertinent test results, reviewed documented beta blocker date and time   History of Anesthesia Complications Negative for: history of anesthetic complications  Airway Mallampati: II  TM Distance: >3 FB Neck ROM: Full    Dental  (+) Dental Advisory Given   Pulmonary neg pulmonary ROS, former smoker,    Pulmonary exam normal        Cardiovascular hypertension, Pt. on medications and Pt. on home beta blockers + Cardiac Stents  negative cardio ROS Normal cardiovascular exam     Neuro/Psych negative neurological ROS  negative psych ROS   GI/Hepatic negative GI ROS, Neg liver ROS,   Endo/Other  negative endocrine ROS  Renal/GU   negative genitourinary   Musculoskeletal negative musculoskeletal ROS (+)   Abdominal   Peds negative pediatric ROS (+)  Hematology negative hematology ROS (+)   Anesthesia Other Findings   Reproductive/Obstetrics negative OB ROS                             Anesthesia Physical Anesthesia Plan  ASA: III  Anesthesia Plan: General   Post-op Pain Management:    Induction: Intravenous  PONV Risk Score and Plan: 4 or greater and Ondansetron, Dexamethasone, Scopolamine patch - Pre-op and Diphenhydramine  Airway Management Planned: LMA  Additional Equipment:   Intra-op Plan:   Post-operative Plan: Extubation in OR  Informed Consent: I have reviewed the patients History and Physical, chart, labs and discussed the procedure including the risks, benefits and alternatives for the proposed anesthesia with the patient or authorized representative who has indicated his/her understanding and acceptance.   Dental advisory given  Plan Discussed with: CRNA and Anesthesiologist  Anesthesia Plan Comments:          Anesthesia Quick Evaluation

## 2017-12-13 NOTE — Op Note (Signed)
Operative Note  Preoperative diagnosis:  1.  Right renal and ureteral calculi  Postoperative diagnosis: 1.  Right renal calculus  Procedure(s): 1.  Cystoscopy with right retrograde pyelogram with interpretation, right ureteroscopy with laser lithotripsy, ureteral stent placement, fluoroscopy less than 1 hour  Surgeon: Link Snuffer, MD  Assistants: None  Anesthesia: General  Complications: None immediate  EBL: Minimal  Specimens: 1.  None  Drains/Catheters: 1.  6 x 24 double-J ureteral stent  Intraoperative findings: Normal urethra and bladder 2.  1 cm right renal calculus fragmented to less than 1 mm fragments 3.  Right retrograde pyelogram revealed a well opacified kidney with no hydronephrosis.  There were no ureteral calculi  Indication: 72 year old female with right flank pain found to have right ureteral calculus as well as right renal calculi presents for the previously mentioned operation.  She had passed several stones over the weekend but desired intervention for the right renal calculi as well.  Description of procedure:  The patient was identified and consent was obtained.  The patient was taken to the operating room and placed in the supine position.  The patient was placed under general anesthesia.  Perioperative antibiotics were administered.  The patient was placed in dorsal lithotomy.  Patient was prepped and draped in a standard sterile fashion and a timeout was performed.  A 21 French rigid cystoscope was advanced into the urethra and into the bladder.  Complete cystoscopy was performed with no abnormal findings.  The right ureter was cannulated with a sensor wire which was advanced up to the kidney under fluoroscopic guidance.  A semirigid ureteroscope was advanced alongside the wire up to the renal pelvis with no ureteral calculi seen.  Retrograde pyelogram was performed through the scope.  A second sensor wire was advanced through the scope and the semirigid  ureteroscope was withdrawn.  I then advanced a 12 x 14 ureteral access sheath over 1 of the wires under continuous fluoroscopic guidance which passed easily up to the renal pelvis.  The inner sheath along with the wire were withdrawn.  Flexible ureteroscopy was performed in the stone of interest was encountered which was fragmented to less than 1 mm fragments.  I did not see any other clinically significant stone fragments within the entire kidney when I performed a pyeloscopy.  I then withdrew the scope along with the access sheath and visualize the ureter upon removal.  There were no ureteral calculi or obvious ureteral injury seen.  I then backloaded the wire onto a rigid cystoscope which was advanced into the bladder.  I placed a 6 x 24 double-J ureteral stent in a standard fashion followed by removal of the wire.  Fluoroscopy confirmed proximal placement and direct visualization confirmed a good coil within the bladder.  This concluded the operation.  The patient tolerated procedure well and was stable postoperatively.  Plan: Return in 1 week for ureteral stent removal.

## 2017-12-13 NOTE — Discharge Instructions (Signed)

## 2017-12-13 NOTE — H&P (Signed)
CC: I have kidney stones.  HPI: Erika Russell is a 72 year-old female patient who is here for renal calculi.  The problem is on the right side. She first stated noticing pain on approximately 12/06/2017. This is her first kidney stone. She is currently having flank pain and back pain. She denies having groin pain, nausea, vomiting, fever, and chills. She has not caught a stone in her urine strainer since her symptoms began.   She has never had surgical treatment for calculi in the past.   The patient went to the emergency department with right flank pain and nausea 2 days ago. She was found to have a 4 mm right ureteropelvic junction calculus and multiple right stones up to 9 mm in size. She had no left-sided stones. She continues to have some left-sided pain but it has improved and it is tolerable. She is taking Flomax. She is straining her urine. She denies any fever, chills, nausea, vomiting. Afebrile in the office here.     ALLERGIES: Codeine    MEDICATIONS: Amlodipine Besylate 5 mg tablet  Aspir 81  Benicar  Dexilant  Glycopyrrolate  Magnesium  Potassium Chloride  Toprol Xl 50 mg tablet, extended release 24 hr     GU PSH: None   NON-GU PSH: Appendectomy (laparoscopic) Cardiac Stent Placement    GU PMH: None   NON-GU PMH: Arthritis Hypertension    FAMILY HISTORY: 2 daughters - Other   SOCIAL HISTORY: Marital Status: Widowed Preferred Language: English; Race: Black or African American Current Smoking Status: Patient smokes.   Tobacco Use Assessment Completed: Used Tobacco in last 30 days? Does not drink caffeine.    REVIEW OF SYSTEMS:    GU Review Female:   Patient denies frequent urination, hard to postpone urination, burning /pain with urination, get up at night to urinate, leakage of urine, stream starts and stops, trouble starting your stream, have to strain to urinate, and being pregnant.  Gastrointestinal (Upper):   Patient denies nausea, vomiting, and  indigestion/ heartburn.  Gastrointestinal (Lower):   Patient reports diarrhea. Patient denies constipation.  Constitutional:   Patient denies fever, night sweats, weight loss, and fatigue.  Skin:   Patient denies skin rash/ lesion and itching.  Eyes:   Patient denies blurred vision and double vision.  Ears/ Nose/ Throat:   Patient reports sinus problems. Patient denies sore throat.  Hematologic/Lymphatic:   Patient denies swollen glands and easy bruising.  Cardiovascular:   Patient denies leg swelling and chest pains.  Respiratory:   Patient denies cough and shortness of breath.  Endocrine:   Patient denies excessive thirst.  Musculoskeletal:   Patient reports back pain. Patient denies joint pain.  Neurological:   Patient denies headaches and dizziness.  Psychologic:   Patient denies depression and anxiety.   VITAL SIGNS:      12/08/2017 10:14 AM  Weight 142 lb / 64.41 kg  Height 63 in / 160.02 cm  BP 130/85 mmHg  Heart Rate 67 /min  Temperature 98.2 F / 36.7 C  BMI 25.2 kg/m   MULTI-SYSTEM PHYSICAL EXAMINATION:    Constitutional: Well-nourished. No physical deformities. Normally developed. Good grooming.  Respiratory: No labored breathing, no use of accessory muscles.  Cardiovascular: Normal temperature, adequate perfusion of extremities  Skin: No paleness, no jaundice  Neurologic / Psychiatric: Oriented to time, oriented to place, oriented to person. No depression, no anxiety, no agitation.  Gastrointestinal: No mass, no tenderness, no rigidity, non obese abdomen.  Eyes: Normal conjunctivae. Normal eyelids.  Musculoskeletal: Normal gait and station of head and neck.     PAST DATA REVIEWED:  Source Of History:  Patient  X-Ray Review: C.T. Abdomen/Pelvis: Reviewed Films. Reviewed Report. Discussed With Patient.     PROCEDURES:         KUB - [id number]  A single view of the abdomen is obtained.  Renal calculi are not visible on KUB.               Urinalysis  w/Scope Dipstick Dipstick Cont'd Micro  Color: Yellow Bilirubin: Neg mg/dL WBC/hpf: 0 - 5/hpf  Appearance: Cloudy Ketones: Neg mg/dL RBC/hpf: >60/hpf  Specific Gravity: 1.020 Blood: 3+ ery/uL Bacteria: Rare (0-9/hpf)  pH: <=5.0 Protein: 1+ mg/dL Cystals: NS (Not Seen)  Glucose: Neg mg/dL Urobilinogen: 0.2 mg/dL Casts: NS (Not Seen)    Nitrites: Neg Trichomonas: Not Present    Leukocyte Esterase: Neg leu/uL Mucous: Not Present      Epithelial Cells: 0 - 5/hpf      Yeast: NS (Not Seen)      Sperm: Not Present    ASSESSMENT:      ICD-10 Details  1 GU:   Ureteral calculus - N20.1   2   Flank Pain - R10.84    PLAN:           Orders X-Rays: KUB          Schedule         Document Letter(s):  Created for Patient: Clinical Summary         Notes:   We discussed the management of urinary stones. These options include observation, ureteroscopy, and shockwave lithotripsy. We discussed which options are relevant to these particular stones. We discussed the natural history of stones as well as the complications of untreated stones and the impact on quality of life without treatment as well as with each of the above listed treatments. We also discussed the efficacy of each treatment in its ability to clear the stone burden. With any of these management options I discussed the signs and symptoms of infection and the need for emergent treatment should these be experienced. For each option we discussed the ability of each procedure to clear the patient of their stone burden.   For observation I described the risks which include but are not limited to silent renal damage, life-threatening infection, need for emergent surgery, failure to pass stone, and pain.   For ureteroscopy I described the risks which include heart attack, stroke, pulmonary embolus, death, bleeding, infection, damage to contiguous structures, positioning injury, ureteral stricture, ureteral avulsion, ureteral injury, need for  ureteral stent, inability to perform ureteroscopy, need for an interval procedure, inability to clear stone burden, stent discomfort and pain.   For shockwave lithotripsy I described the risks which include arrhythmia, kidney contusion, kidney hemorrhage, need for transfusion, pain, inability to break up stone, inability to pass stone fragments, Steinstrasse, infection associated with obstructing stones, need for different surgical procedure, need for repeat shockwave lithotripsy, and death. This is not a good option since her stone is not visible on KUB.   She would like to proceed with right ureteroscopy with laser lithotripsy and ureteral stent placement. This will allow [place] to take care of the stones in the kidney as well.    Signed by Link Snuffer, III, M.D. on 12/08/17 at 11:00 AM (EDT

## 2017-12-13 NOTE — Transfer of Care (Signed)
Immediate Anesthesia Transfer of Care Note  Patient: Erika Russell  Procedure(s) Performed: RIGHT URETEROSCOPY WITH RETROGRADE PYLEOGRAM/HOLMIUM LASER/STENT PLACEMENT (Right Ureter)  Patient Location: PACU  Anesthesia Type:General  Level of Consciousness: drowsy  Airway & Oxygen Therapy: Patient Spontanous Breathing and Patient connected to face mask  Post-op Assessment: Report given to RN and Post -op Vital signs reviewed and stable  Post vital signs: Reviewed and stable  Last Vitals:  Vitals Value Taken Time  BP 135/61 12/13/2017 11:31 AM  Temp    Pulse 65 12/13/2017 11:32 AM  Resp 12 12/13/2017 11:32 AM  SpO2 99 % 12/13/2017 11:32 AM  Vitals shown include unvalidated device data.  Last Pain:  Vitals:   12/13/17 0808  TempSrc:   PainSc: 2       Patients Stated Pain Goal: 2 (86/76/19 5093)  Complications: No apparent anesthesia complications

## 2017-12-13 NOTE — Anesthesia Procedure Notes (Signed)
Procedure Name: LMA Insertion Date/Time: 12/13/2017 10:37 AM Performed by: Claudia Desanctis, CRNA Pre-anesthesia Checklist: Emergency Drugs available, Patient identified, Suction available and Patient being monitored Patient Re-evaluated:Patient Re-evaluated prior to induction Oxygen Delivery Method: Circle system utilized Preoxygenation: Pre-oxygenation with 100% oxygen Induction Type: IV induction Ventilation: Mask ventilation without difficulty LMA: LMA inserted LMA Size: 4.0 Number of attempts: 1 Placement Confirmation: positive ETCO2 and breath sounds checked- equal and bilateral Tube secured with: Tape Dental Injury: Teeth and Oropharynx as per pre-operative assessment

## 2017-12-13 NOTE — Interval H&P Note (Signed)
History and Physical Interval Note:  12/13/2017 10:12 AM  Erika Russell  has presented today for surgery, with the diagnosis of right ureteral stone  The various methods of treatment have been discussed with the patient and family. After consideration of risks, benefits and other options for treatment, the patient has consented to  Procedure(s): RIGHT URETEROSCOPY/HOLMIUM LASER/STENT PLACEMENT (Right) as a surgical intervention .  The patient's history has been reviewed, patient examined, no change in status, stable for surgery.  I have reviewed the patient's chart and labs.  Questions were answered to the patient's satisfaction.     Marton Redwood, III

## 2018-02-11 ENCOUNTER — Other Ambulatory Visit: Payer: Self-pay | Admitting: Internal Medicine

## 2018-02-11 DIAGNOSIS — Z1231 Encounter for screening mammogram for malignant neoplasm of breast: Secondary | ICD-10-CM

## 2018-02-27 IMAGING — CT CT RENAL STONE PROTOCOL
2 of 4 series · 15 of 46 positions shown, 17 images · non-contrast
Comparison: 08/26/2014

CLINICAL DATA: Lower abdominal and pelvic pain with right flank
pain and hematuria.

EXAM:
CT ABDOMEN AND PELVIS WITHOUT CONTRAST
TECHNIQUE: Multidetector CT imaging of the abdomen and pelvis was performed
following the standard protocol without IV contrast.

[Series 2: axial st · axial · 0.62mm/px · z∈[-431,-71]mm · 12 of 83 slices shown, 14 images]
[im 7/83  soft-tissue]
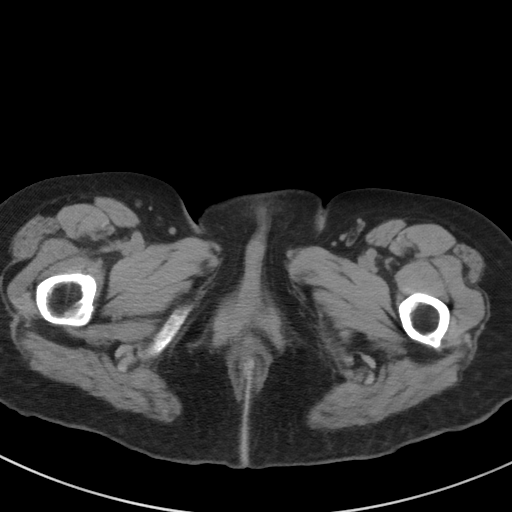
[im 7/83  bone]
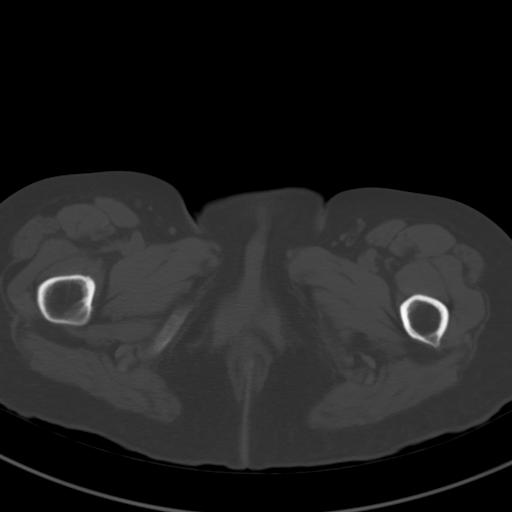
[im 14/83  soft-tissue]
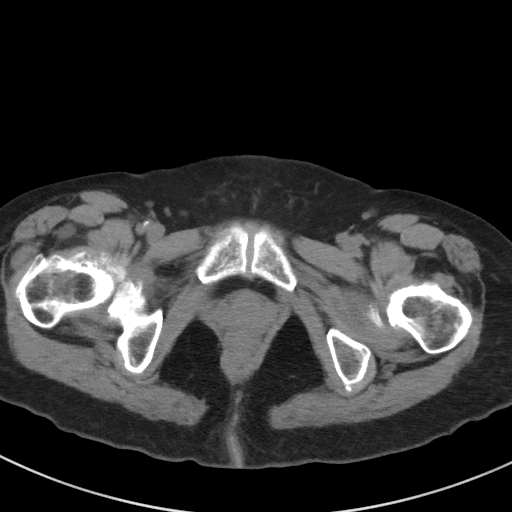
[im 20/83  soft-tissue]
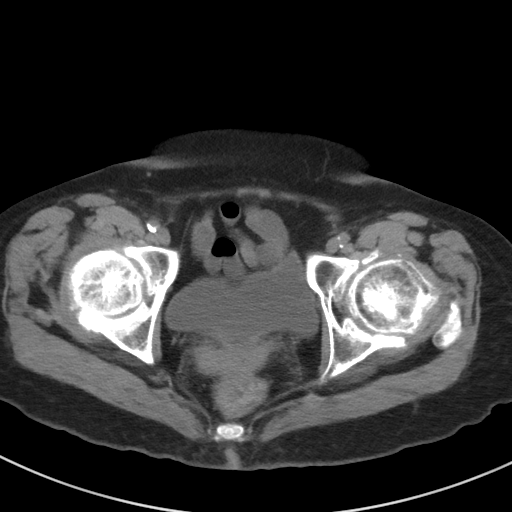
[im 27/83  soft-tissue]
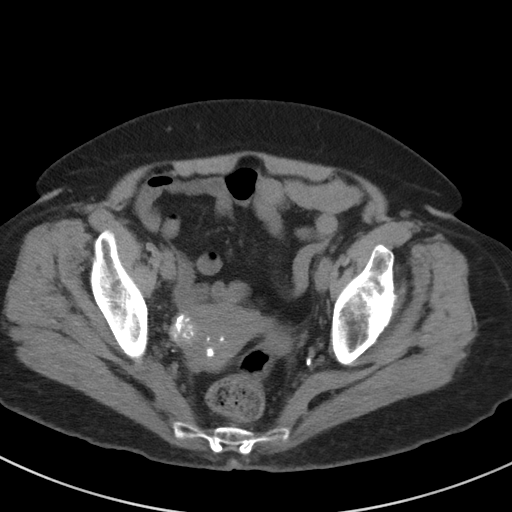
[im 33/83  soft-tissue]
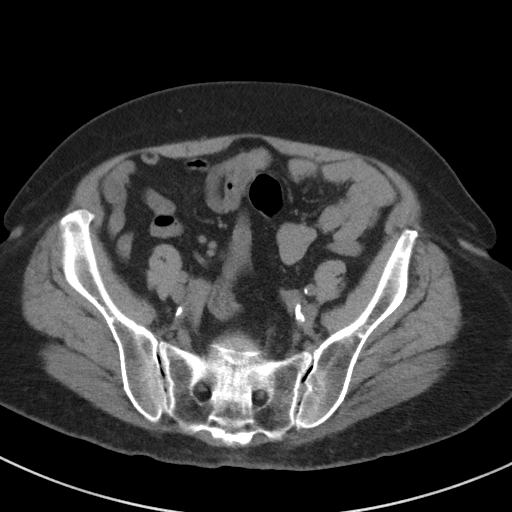
[im 40/83  soft-tissue]
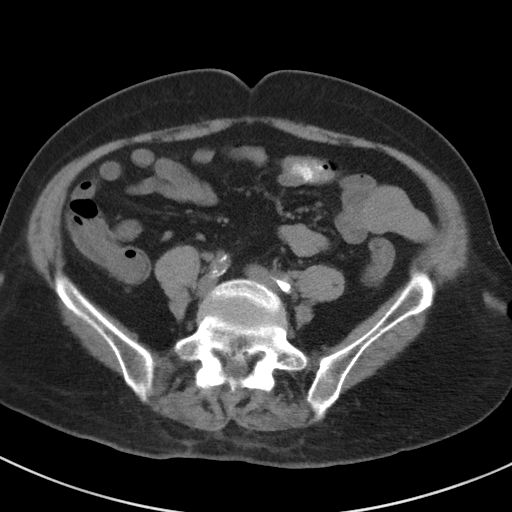
[im 46/83  soft-tissue]
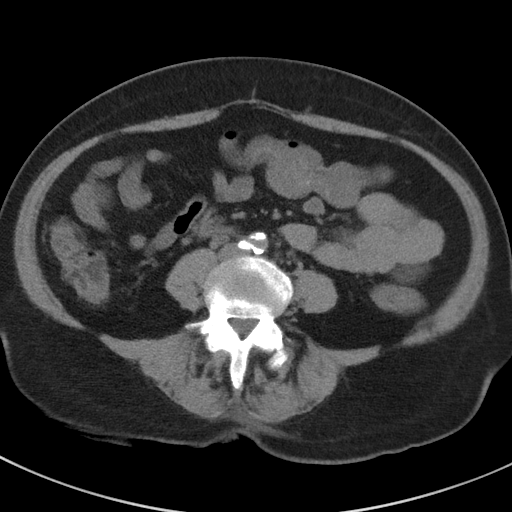
[im 53/83  soft-tissue]
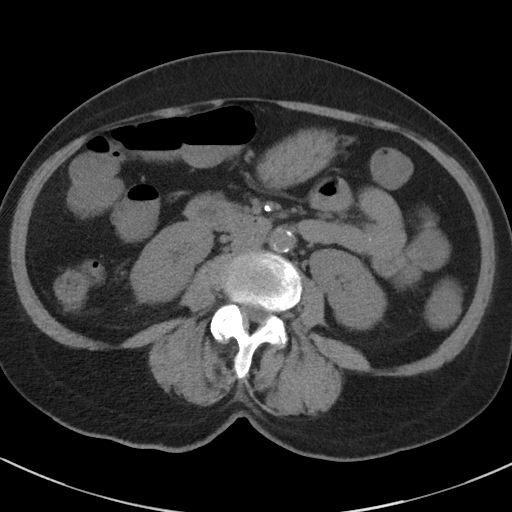
[im 60/83  soft-tissue]
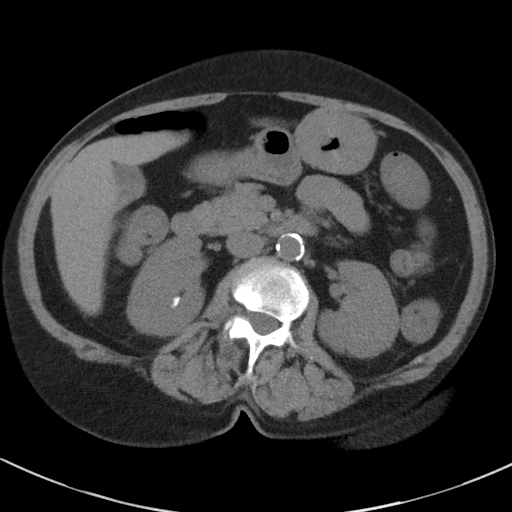
[im 60/83  bone]
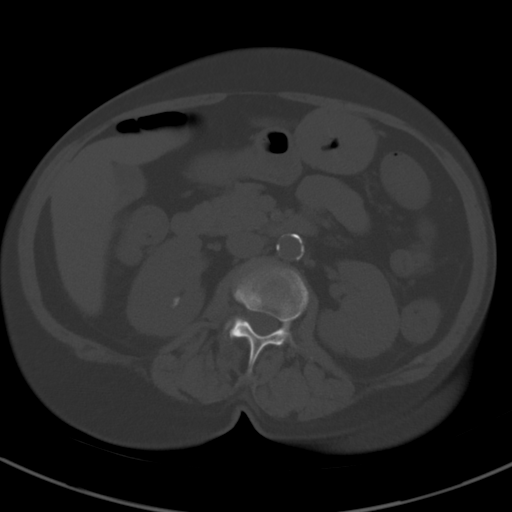
[im 66/83  soft-tissue]
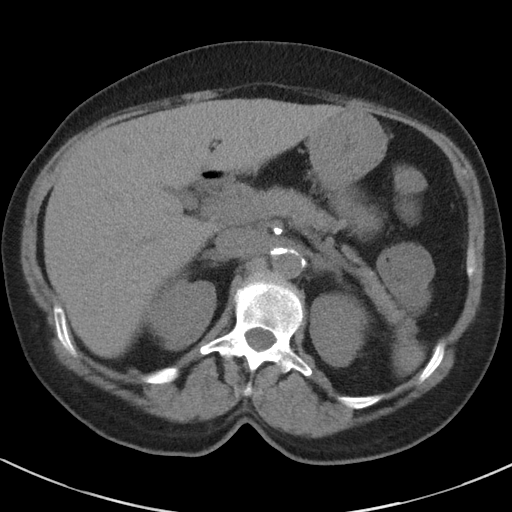
[im 73/83  soft-tissue]
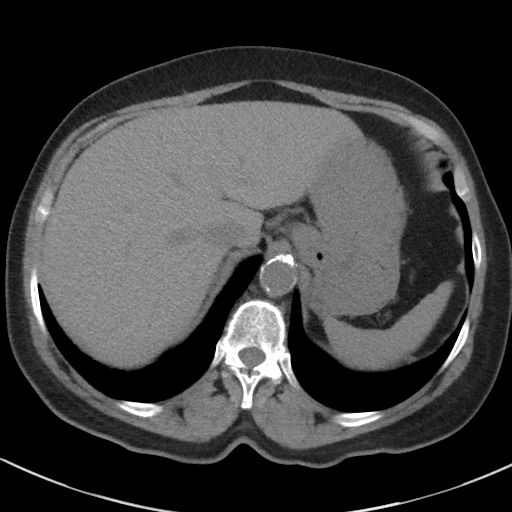
[im 79/83  soft-tissue]
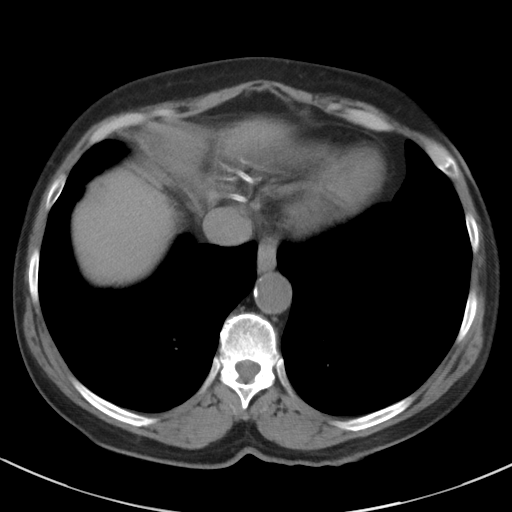

[Series 5: coronal st · coronal · 0.64mm/px · 3 of 82 slices shown]
[im 28/82  soft-tissue]
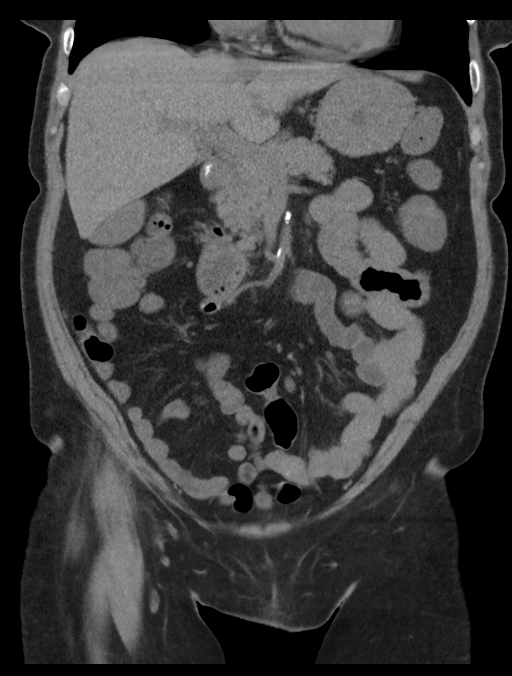
[im 37/82  soft-tissue]
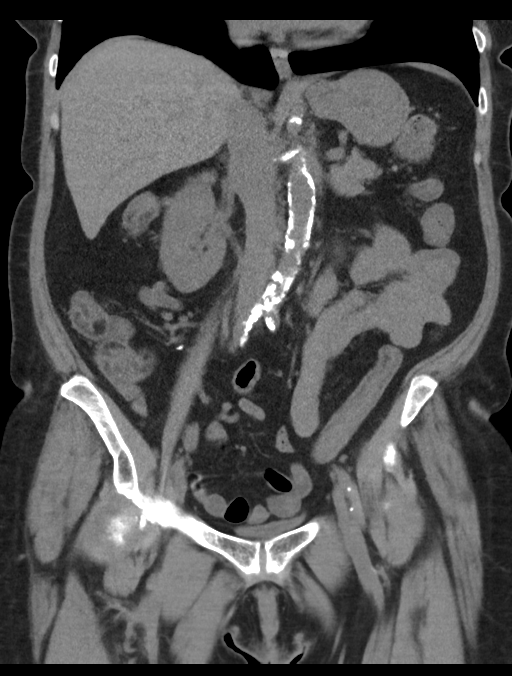
[im 46/82  soft-tissue]
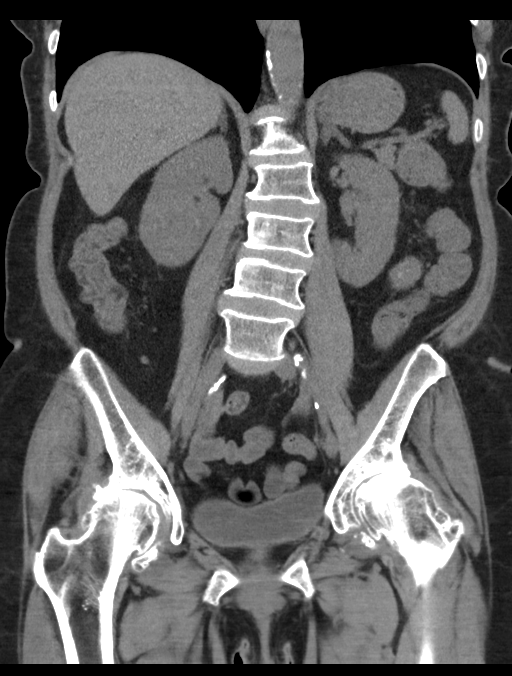

[15 of 46 positions shown; findings below may reference images not displayed]

FINDINGS: Lower chest:  Unremarkable.

Hepatobiliary: No focal abnormality in the liver on this study
without intravenous contrast. There is no evidence for gallstones,
gallbladder wall thickening, or pericholecystic fluid. No
intrahepatic or extrahepatic biliary dilation.

Pancreas: No focal mass lesion. No dilatation of the main duct. No
intraparenchymal cyst. No peripancreatic edema.

Spleen: No splenomegaly. No focal mass lesion.

Adrenals/Urinary Tract: No adrenal nodule or mass. 4 x 7 mm
nonobstructing stone is identified in the lower pole of the right
kidney. No right ureteral stone. No secondary changes in the right
kidney or ureter. No stones are seen in the left kidney or ureter.
No bladder stones.

Stomach/Bowel: Stomach is nondistended. No gastric wall thickening.
No evidence of outlet obstruction. Duodenum is normally positioned
as is the ligament of Treitz. No small bowel wall thickening. No
small bowel dilatation. The terminal ileum is normal.
Nonvisualization of the appendix is consistent with the reported
history of appendectomy. There appears to be mild edema and wall
thickening in the right colon although this segment of colon is not
well distended which can sometimes lead to spur ES appearance of
wall thickening. Probable associated mild pericolonic edema suggests
that this is a real finding.

Vascular/Lymphatic: There is abdominal aortic atherosclerosis
without aneurysm. There is no gastrohepatic or hepatoduodenal
ligament lymphadenopathy. No intraperitoneal or retroperitoneal
lymphadenopathy. No pelvic sidewall lymphadenopathy.

Reproductive: Calcified fibroids noted.  There is no adnexal mass.

Other: No intraperitoneal free fluid.

Musculoskeletal: Degenerative changes are noted in both hips. Bone
windows reveal no worrisome lytic or sclerotic osseous lesions.
IMPRESSION: 1. Right nephrolithiasis without secondary changes in the right
kidney or ureter.
2. Apparent mild wall thickening and edema in the right colon with
subtle pericolonic edema/inflammation. Although this colon is not
well distended, infectious or inflammatory colitis would be a
consideration. There appears to be some prominent fat in the wall of
the right colon which suggest chronic inflammation.
3.  Aortic Atherosclerois ([id number]-170.0)
4. Calcified uterine fibroids.
5. Bilateral hip osteoarthritis

## 2018-03-10 ENCOUNTER — Ambulatory Visit
Admission: RE | Admit: 2018-03-10 | Discharge: 2018-03-10 | Disposition: A | Discharge status: Home / Self Care | Payer: Medicare Other | Source: Ambulatory Visit | Attending: Internal Medicine | Admitting: Internal Medicine

## 2018-03-10 DIAGNOSIS — Z1231 Encounter for screening mammogram for malignant neoplasm of breast: Secondary | ICD-10-CM

## 2018-03-27 ENCOUNTER — Emergency Department (HOSPITAL_BASED_OUTPATIENT_CLINIC_OR_DEPARTMENT_OTHER)
Admission: EM | Admit: 2018-03-27 | Discharge: 2018-03-27 | Disposition: A | Discharge status: Home / Self Care | Payer: Medicare Other | Attending: Emergency Medicine | Admitting: Emergency Medicine

## 2018-03-27 ENCOUNTER — Encounter (HOSPITAL_BASED_OUTPATIENT_CLINIC_OR_DEPARTMENT_OTHER): Payer: Self-pay | Admitting: Emergency Medicine

## 2018-03-27 ENCOUNTER — Telehealth (HOSPITAL_BASED_OUTPATIENT_CLINIC_OR_DEPARTMENT_OTHER): Payer: Self-pay | Admitting: *Deleted

## 2018-03-27 ENCOUNTER — Other Ambulatory Visit: Payer: Self-pay

## 2018-03-27 ENCOUNTER — Emergency Department (HOSPITAL_BASED_OUTPATIENT_CLINIC_OR_DEPARTMENT_OTHER): Payer: Medicare Other

## 2018-03-27 DIAGNOSIS — B373 Candidiasis of vulva and vagina: Secondary | ICD-10-CM | POA: Insufficient documentation

## 2018-03-27 DIAGNOSIS — I10 Essential (primary) hypertension: Secondary | ICD-10-CM | POA: Diagnosis not present

## 2018-03-27 DIAGNOSIS — N3000 Acute cystitis without hematuria: Secondary | ICD-10-CM | POA: Diagnosis not present

## 2018-03-27 DIAGNOSIS — F17210 Nicotine dependence, cigarettes, uncomplicated: Secondary | ICD-10-CM | POA: Insufficient documentation

## 2018-03-27 DIAGNOSIS — B3731 Acute candidiasis of vulva and vagina: Secondary | ICD-10-CM

## 2018-03-27 DIAGNOSIS — R109 Unspecified abdominal pain: Secondary | ICD-10-CM | POA: Diagnosis present

## 2018-03-27 HISTORY — DX: Other specified postprocedural states: Z98.890

## 2018-03-27 LAB — URINALYSIS, ROUTINE W REFLEX MICROSCOPIC
Bilirubin Urine: NEGATIVE
GLUCOSE, UA: NEGATIVE mg/dL
Hgb urine dipstick: NEGATIVE
Ketones, ur: NEGATIVE mg/dL
Nitrite: NEGATIVE
Protein, ur: NEGATIVE mg/dL
pH: 5.5 (ref 5.0–8.0)

## 2018-03-27 LAB — WET PREP, GENITAL
Clue Cells Wet Prep HPF POC: NONE SEEN
SPERM: NONE SEEN
Trich, Wet Prep: NONE SEEN

## 2018-03-27 LAB — URINALYSIS, MICROSCOPIC (REFLEX)

## 2018-03-27 MED ORDER — CEPHALEXIN 500 MG PO CAPS: 500.0000 mg | ORAL_CAPSULE | Freq: Three times a day (TID) | ORAL | 0 refills | Status: AC

## 2018-03-27 MED ORDER — FLUCONAZOLE 200 MG PO TABS: ORAL_TABLET | ORAL | 0 refills | Status: DC

## 2018-03-27 NOTE — ED Provider Notes (Signed)
Emergency Department Provider Note   I have reviewed the triage vital signs and the nursing notes.   HISTORY  Chief Complaint Back Pain   HPI Erika Russell is a 72 y.o. female with PMH of IBS, HLD, HTN, and kidney stone with prior lithotripsy presents to the emergency department for evaluation of suprapubic pain radiating to the right flank. Patient has a prior history of UTI.  She denies any fevers, chills, flulike symptoms.  Denies significant upper abdominal pain, vomiting, diarrhea.  See significant vaginal discharge although states she did try some Monistat recently with no improvement. Reports this feels somewhat similar to her prior kidney stones.   Past Medical History:  Diagnosis Date  . Cervical dysplasia 1988  . Colitis    collagenous  . Elevated cholesterol   . Heart block   . History of lithotripsy 12/2017  . Hypertension   . IBS (irritable bowel syndrome)   . Kidney stone   . Osteopenia 09/2015   T score -1.8 FRAX 11%/2.8%    Patient Active Problem List   Diagnosis Date Noted  . Elevated cholesterol   . Heart block   . CIN (conjunctival intraepithelial neoplasia)     Past Surgical History:  Procedure Laterality Date  . APPENDECTOMY    . CARDIAC STINT  09  . CYSTOSCOPY/URETEROSCOPY/HOLMIUM LASER/STENT PLACEMENT Right 12/13/2017   Procedure: RIGHT URETEROSCOPY WITH RETROGRADE PYLEOGRAM/HOLMIUM LASER/STENT PLACEMENT;  Surgeon: Lucas Mallow, MD;  Location: [facility];  Service: Urology;  Laterality: Right;  . GYNECOLOGIC CRYOSURGERY     Allergies Codeine and Pyridium [phenazopyridine]  Family History  Problem Relation Age of Onset  . Cancer Father        COLON  . Diabetes Father   . Breast cancer Sister        Age 61's    Social History Social History   Tobacco Use  . Smoking status: Heavy Tobacco Smoker    Packs/day: 0.50    Types: Cigarettes  . Smokeless tobacco: Never Used  Substance Use Topics  . Alcohol use: Yes   Alcohol/week: 7.0 standard drinks    Types: 7 Standard drinks or equivalent per week    Comment: red wine x 1 nightly  . Drug use: No    Review of Systems  Constitutional: No fever/chills Eyes: No visual changes. ENT: No sore throat. Cardiovascular: Denies chest pain. Respiratory: Denies shortness of breath. Gastrointestinal: Positive suprapubic abdominal pain and right flank pain.  No nausea, no vomiting.  No diarrhea.  No constipation. Genitourinary: Negative for dysuria. Positive vaginal discharge.  Musculoskeletal: Negative for back pain. Skin: Negative for rash. Neurological: Negative for headaches, focal weakness or numbness.  10-point ROS otherwise negative.  ____________________________________________   PHYSICAL EXAM:  VITAL SIGNS: ED Triage Vitals  Enc Vitals Group     BP 03/27/18 1348 (!) 155/90     Pulse Rate 03/27/18 1348 78     Resp 03/27/18 1348 18     Temp 03/27/18 1348 98.2 F (36.8 C)     Temp Source 03/27/18 1348 Oral     SpO2 03/27/18 1348 100 %     Weight 03/27/18 1348 140 lb (63.5 kg)     Height 03/27/18 1348 5\' 3"  (1.6 m)     Pain Score 03/27/18 1346 7   Constitutional: Alert and oriented. Well appearing and in no acute distress. Sitting on the edge of bed.  Eyes: Conjunctivae are normal.  Head: Atraumatic. Nose: No congestion/rhinnorhea. Mouth/Throat: Mucous membranes are  moist.  Oropharynx non-erythematous. Neck: No stridor.  Cardiovascular: Normal rate, regular rhythm. Good peripheral circulation. Grossly normal heart sounds.   Respiratory: Normal respiratory effort.  No retractions. Lungs CTAB. Gastrointestinal: Soft with mild suprapubic tenderness. No flank tenderness or CVA tenderness. No distention.  GU: Exam performed with nurse chaperone. Moderate white discharge. No CMT or adnexal tenderness. No masses. No bleeding.  Musculoskeletal: No lower extremity tenderness nor edema. No gross deformities of extremities. Neurologic:  Normal  speech and language. No gross focal neurologic deficits are appreciated.  Skin:  Skin is warm, dry and intact. No rash noted.  ____________________________________________   LABS (all labs ordered are listed, but only abnormal results are displayed)  Labs Reviewed  WET PREP, GENITAL - Abnormal; Notable for the following components:      Result Value   Yeast Wet Prep HPF POC PRESENT (*)    WBC, Wet Prep HPF POC MANY (*)    All other components within normal limits  URINALYSIS, ROUTINE W REFLEX MICROSCOPIC - Abnormal; Notable for the following components:   APPearance CLOUDY (*)    Specific Gravity, Urine >1.030 (*)    Leukocytes, UA TRACE (*)    All other components within normal limits  URINALYSIS, MICROSCOPIC (REFLEX) - Abnormal; Notable for the following components:   Bacteria, UA MANY (*)    All other components within normal limits  GC/CHLAMYDIA PROBE AMP ([place]) NOT AT [facility]   ____________________________________________  RADIOLOGY  Ct Renal Stone Study  Result Date: 03/27/2018 CLINICAL DATA:  Low back pain.  History of renal stones. EXAM: CT ABDOMEN AND PELVIS WITHOUT CONTRAST TECHNIQUE: Multidetector CT imaging of the abdomen and pelvis was performed following the standard protocol without IV contrast. COMPARISON:  CT abdomen pelvis-12/06/2017; 06/05/2017; 08/26/2014 FINDINGS: Lower chest: Limited visualization of the lower thorax demonstrates minimal subsegmental atelectasis within the imaged bilateral lung bases, right greater than left. There is an approximately 0.5 cm nodule within the imaged left lower lobe which is unchanged compared to the 07/2014 examination and thus of benign etiology. No discrete focal airspace opacities. No pleural effusion. Normal heart size. Coronary artery calcifications. No pericardial effusion. Hepatobiliary: Normal hepatic contour. Normal noncontrast appearance of the gallbladder given degree distention. No radiopaque gallstones. No  ascites. Pancreas: Normal noncontrast appearance of the pancreas Spleen: Normal noncontrast appearance of the spleen Adrenals/Urinary Tract: Normal noncontrast appearance of the bilateral kidneys. No renal stones. No renal stones are seen along the expected course of either ureter or the urinary bladder. Note is again made calcified phleboliths within the right gonadal vein, similar to previous examinations. Normal noncontrast appearance of the urinary bladder given underdistention. No urine obstruction or perinephric stranding. Normal noncontrast appearance of the bilateral adrenal glands. Stomach/Bowel: Redemonstrated patulous distension of the cecal tip. Normal noncontrast appearance of the terminal ileum. The appendix is not visualized, however there is no pericecal inflammatory change. Apparent circumferential wall thickening involving the stomach appears similar to multiple previous examinations may be accentuated due to underdistention. No pneumoperitoneum, pneumatosis or portal venous gas. Vascular/Lymphatic: Large amount of irregular calcified plaque within a normal caliber abdominal aorta. No bulky retroperitoneal, mesenteric, pelvic or inguinal lymphadenopathy. Reproductive: Normal noncontrast appearance of the pelvic organs for age with dystrophic calcifications within the uterus presumably degenerating uterine fibroids. No discrete adnexal lesion. No free fluid the pelvic cul-de-sac. Other: Small mesenteric fat containing periumbilical hernia. Musculoskeletal: No acute or aggressive osseous abnormalities. Mild rotatory scoliotic curvature of the thoracolumbar spine with dominant caudal component convex to the  left. Bilateral facet degenerative change of the lower lumbar spine. Severe degenerative change the bilateral hips, left greater than right with complete joint space loss, subchondral sclerosis and osteophytosis. IMPRESSION: 1. No evidence of nephrolithiasis or urinary obstruction. 2. Bilateral  facet degenerative change of the lower lumbar spine. 3. Severe degenerative change of the bilateral hips, left greater than right. 4.  Aortic Atherosclerosis (ICD10-I70.0). Electronically Signed   By: Sandi Mariscal M.D.   On: 03/27/2018 14:35    ____________________________________________   PROCEDURES  Procedure(s) performed:   Procedures  None ____________________________________________   INITIAL IMPRESSION / ASSESSMENT AND PLAN / ED COURSE  Pertinent labs & imaging results that were available during my care of the patient were reviewed by me and considered in my medical decision making (see chart for details).  Patient presents to the emergency department with lower back/flank pain radiating to the suprapubic area.  Is mild suprapubic tenderness but denies dysuria.  No fevers or chills to suspect developing pyelonephritis.  Patient had recent kidney stone requiring lithotripsy.  CT renal shows no stone but diffuse arthritis changes in the lower back and hips.  CT renal reviewed. No acute findings. Patient with degenerative changes noted. Patient with yeast on wet prep. No BV. Covering with BV and will treat UTI. No concern for pyelonephritis clinically and most pain is suprapubic.  ____________________________________________  FINAL CLINICAL IMPRESSION(S) / ED DIAGNOSES  Final diagnoses:  Acute cystitis without hematuria  Vaginal candidiasis    NEW OUTPATIENT MEDICATIONS STARTED DURING THIS VISIT:  Discharge Medication List as of 03/27/2018  3:26 PM    START taking these medications   Details  cephALEXin (KEFLEX) 500 MG capsule Take 1 capsule (500 mg total) by mouth 3 (three) times daily for 7 days., Starting Sun 03/27/2018, Until Sun 04/03/2018, Print    fluconazole (DIFLUCAN) 200 MG tablet Take 1 tablet today and another in 72 hours., Print        Note:  This document was prepared using Dragon voice recognition software and may include unintentional dictation  errors.  Nanda Quinton, MD Emergency Medicine    Long, [facility], MD 03/28/18 [phone]

## 2018-03-27 NOTE — ED Triage Notes (Signed)
Lower bilateral back pain. States feels like she may have a bladder infection. Denies dysuria. Sx onset 2 days ago.

## 2018-03-27 NOTE — Discharge Instructions (Signed)
You have been seen in the Emergency Department (ED) today for pain when urinating.  Your workup today suggests that you have a urinary tract infection (UTI).  Please take your antibiotic as prescribed and over-the-counter pain medication (Tylenol) as needed, but no more than recommended on the label instructions.  Drink PLENTY of fluids.  You also have evidence of a yeast infection. Take the Fluconazole as directed.   Call your regular doctor to schedule the next available appointment to follow up on todays ED visit, or return immediately to the ED if your pain worsens, you have decreased urine production, develop fever, persistent vomiting, or other symptoms that concern you.

## 2018-03-29 LAB — GC/CHLAMYDIA PROBE AMP (~~LOC~~) NOT AT ARMC
CHLAMYDIA, DNA PROBE: NEGATIVE
Neisseria Gonorrhea: NEGATIVE

## 2018-06-01 DIAGNOSIS — Z87442 Personal history of urinary calculi: Secondary | ICD-10-CM

## 2018-06-01 HISTORY — DX: Personal history of urinary calculi: Z87.442

## 2018-08-19 ENCOUNTER — Encounter: Payer: Medicare Other | Admitting: Gynecology

## 2018-09-13 ENCOUNTER — Inpatient Hospital Stay: Admit: 2018-09-13 | Payer: Medicare Other | Admitting: Orthopedic Surgery

## 2018-09-13 SURGERY — ARTHROPLASTY, HIP, TOTAL, ANTERIOR APPROACH
Anesthesia: Spinal | Laterality: Left

## 2018-10-05 ENCOUNTER — Encounter: Payer: Medicare Other | Admitting: Gynecology

## 2018-11-22 ENCOUNTER — Other Ambulatory Visit: Payer: Self-pay

## 2018-11-23 ENCOUNTER — Encounter: Payer: Self-pay | Admitting: Gynecology

## 2018-11-23 ENCOUNTER — Ambulatory Visit (INDEPENDENT_AMBULATORY_CARE_PROVIDER_SITE_OTHER): Payer: Medicare Other | Admitting: Gynecology

## 2018-11-23 VITALS — BP 120/66 | Ht 63.0 in | Wt 137.0 lb

## 2018-11-23 DIAGNOSIS — M8580 Other specified disorders of bone density and structure, unspecified site: Secondary | ICD-10-CM | POA: Diagnosis not present

## 2018-11-23 DIAGNOSIS — N952 Postmenopausal atrophic vaginitis: Secondary | ICD-10-CM | POA: Diagnosis not present

## 2018-11-23 DIAGNOSIS — Z9189 Other specified personal risk factors, not elsewhere classified: Secondary | ICD-10-CM | POA: Diagnosis not present

## 2018-11-23 DIAGNOSIS — Z01419 Encounter for gynecological examination (general) (routine) without abnormal findings: Secondary | ICD-10-CM

## 2018-11-23 DIAGNOSIS — L292 Pruritus vulvae: Secondary | ICD-10-CM

## 2018-11-23 MED ORDER — NYSTATIN-TRIAMCINOLONE 100000-0.1 UNIT/GM-% EX OINT: 1.0000 "application " | TOPICAL_OINTMENT | Freq: Two times a day (BID) | CUTANEOUS | 2 refills | Status: AC

## 2018-11-23 NOTE — Progress Notes (Signed)
    Erika Russell 1945/11/11 [id number]        73 y.o.  [id number] for breast and pelvic exam.  Reports occasional vulvar itching.  Uses OTC hydrocortisone cream.  No discharge or odor.  No urinary symptoms.  Past medical history,surgical history, problem list, medications, allergies, family history and social history were all reviewed and documented as reviewed in the EPIC chart.  ROS:  Performed with pertinent positives and negatives included in the history, assessment and plan.   Additional significant findings : None   Exam: Caryn Bee assistant Vitals:   11/23/18 1359  BP: 120/66  Weight: 137 lb (62.1 kg)  Height: 5\' 3"  (1.6 m)   Body mass index is 24.27 kg/m.  General appearance:  Normal affect, orientation and appearance. Skin: Grossly normal HEENT: Without gross lesions.  No cervical or supraclavicular adenopathy. Thyroid normal.  Lungs:  Clear without wheezing, rales or rhonchi Cardiac: RR, without RMG Abdominal:  Soft, nontender, without masses, guarding, rebound, organomegaly or hernia Breasts:  Examined lying and sitting without masses, retractions, discharge or axillary adenopathy. Pelvic:  Ext, BUS, Vagina: With atrophic changes  Cervix: With atrophic changes  Uterus: Anteverted, normal size, shape and contour, midline and mobile nontender   Adnexa: Without masses or tenderness    Anus and perineum: Normal   Rectovaginal: Normal sphincter tone without palpated masses or tenderness.    Assessment/Plan:  73 y.o. [name] female for breast and pelvic exam  1. Postmenopausal/atrophic genital changes.  No significant menopausal symptoms or any vaginal bleeding. 2. Vulvar itching occasionally.  Uses hydrocortisone cream.  Discussed possibilities and options.  Exam was normal without evidence of significant discharge or vulvar abnormalities.  Prescription for Mycolog provided to use PRN.  She will follow-up if symptoms continue. 3. Osteopenia.  DEXA 2017 T score  -1.7 FRAX 11% / 2.8%.  Schedule follow-up DEXA now and she agrees to do so. 4. Mammography 03/2018.  Continue with annual mammography this fall.  Breast exam normal today. 5. Pap smear 2016.  No Pap smear done today.  History of dysplasia in 1988 with cryosurgery and normal Pap smears since.  Patient is comfortable with stop screening recommendations per current protocols. 6. Colonoscopy 2019.  Repeat at their recommended interval. 7. Health maintenance.  No routine lab work done as patient does this elsewhere.  Follow-up for bone density.  Follow-up in 1 year for annual exam   Anastasio Auerbach MD, 2:42 PM 11/23/2018

## 2018-11-23 NOTE — Patient Instructions (Signed)
Follow-up for the bone density as scheduled.  Follow-up in 1 year for annual exam

## 2018-12-07 ENCOUNTER — Other Ambulatory Visit: Payer: Self-pay | Admitting: Gynecology

## 2018-12-07 ENCOUNTER — Other Ambulatory Visit: Payer: Self-pay

## 2018-12-07 ENCOUNTER — Ambulatory Visit (INDEPENDENT_AMBULATORY_CARE_PROVIDER_SITE_OTHER): Payer: Medicare Other

## 2018-12-07 DIAGNOSIS — M8589 Other specified disorders of bone density and structure, multiple sites: Secondary | ICD-10-CM | POA: Diagnosis not present

## 2018-12-07 DIAGNOSIS — Z780 Asymptomatic menopausal state: Secondary | ICD-10-CM

## 2018-12-07 DIAGNOSIS — M8580 Other specified disorders of bone density and structure, unspecified site: Secondary | ICD-10-CM

## 2018-12-08 ENCOUNTER — Encounter: Payer: Self-pay | Admitting: Gynecology

## 2019-02-16 ENCOUNTER — Other Ambulatory Visit: Payer: Self-pay

## 2019-02-16 ENCOUNTER — Emergency Department (HOSPITAL_BASED_OUTPATIENT_CLINIC_OR_DEPARTMENT_OTHER): Payer: Medicare Other

## 2019-02-16 ENCOUNTER — Emergency Department (HOSPITAL_BASED_OUTPATIENT_CLINIC_OR_DEPARTMENT_OTHER)
Admission: EM | Admit: 2019-02-16 | Discharge: 2019-02-16 | Disposition: A | Discharge status: Home / Self Care | Payer: Medicare Other | Attending: Emergency Medicine | Admitting: Emergency Medicine

## 2019-02-16 ENCOUNTER — Encounter (HOSPITAL_BASED_OUTPATIENT_CLINIC_OR_DEPARTMENT_OTHER): Payer: Self-pay | Admitting: Emergency Medicine

## 2019-02-16 DIAGNOSIS — Z7982 Long term (current) use of aspirin: Secondary | ICD-10-CM | POA: Diagnosis not present

## 2019-02-16 DIAGNOSIS — R1031 Right lower quadrant pain: Secondary | ICD-10-CM | POA: Diagnosis present

## 2019-02-16 DIAGNOSIS — I10 Essential (primary) hypertension: Secondary | ICD-10-CM | POA: Diagnosis not present

## 2019-02-16 DIAGNOSIS — N201 Calculus of ureter: Secondary | ICD-10-CM

## 2019-02-16 DIAGNOSIS — Z79899 Other long term (current) drug therapy: Secondary | ICD-10-CM | POA: Insufficient documentation

## 2019-02-16 DIAGNOSIS — F17210 Nicotine dependence, cigarettes, uncomplicated: Secondary | ICD-10-CM | POA: Diagnosis not present

## 2019-02-16 LAB — CBC WITH DIFFERENTIAL/PLATELET
Abs Immature Granulocytes: 0.04 10*3/uL (ref 0.00–0.07)
Basophils Absolute: 0 10*3/uL (ref 0.0–0.1)
Basophils Relative: 0 %
Eosinophils Absolute: 0 10*3/uL (ref 0.0–0.5)
Eosinophils Relative: 0 %
HCT: 36.5 % (ref 36.0–46.0)
Hemoglobin: 11.5 g/dL — ABNORMAL LOW (ref 12.0–15.0)
Immature Granulocytes: 0 %
Lymphocytes Relative: 28 %
Lymphs Abs: 2.7 10*3/uL (ref 0.7–4.0)
MCH: 21.3 pg — ABNORMAL LOW (ref 26.0–34.0)
MCHC: 31.5 g/dL (ref 30.0–36.0)
MCV: 67.6 fL — ABNORMAL LOW (ref 80.0–100.0)
Monocytes Absolute: 0.7 10*3/uL (ref 0.1–1.0)
Monocytes Relative: 7 %
Neutro Abs: 6.2 10*3/uL (ref 1.7–7.7)
Neutrophils Relative %: 65 %
Platelets: 215 10*3/uL (ref 150–400)
RBC: 5.4 MIL/uL — ABNORMAL HIGH (ref 3.87–5.11)
RDW: 15.5 % (ref 11.5–15.5)
Smear Review: NORMAL
WBC: 9.6 10*3/uL (ref 4.0–10.5)
nRBC: 0 % (ref 0.0–0.2)

## 2019-02-16 LAB — BASIC METABOLIC PANEL
Anion gap: 13 (ref 5–15)
BUN: 13 mg/dL (ref 8–23)
CO2: 19 mmol/L — ABNORMAL LOW (ref 22–32)
Calcium: 9.4 mg/dL (ref 8.9–10.3)
Chloride: 106 mmol/L (ref 98–111)
Creatinine, Ser: 0.62 mg/dL (ref 0.44–1.00)
GFR calc Af Amer: >60 mL/min (ref >60)
GFR calc non Af Amer: >60 mL/min (ref >60)
Glucose, Bld: 148 mg/dL — ABNORMAL HIGH (ref 70–99)
Potassium: 3.7 mmol/L (ref 3.5–5.1)
Sodium: 138 mmol/L (ref 135–145)

## 2019-02-16 LAB — URINALYSIS, ROUTINE W REFLEX MICROSCOPIC
Bilirubin Urine: NEGATIVE
Glucose, UA: NEGATIVE mg/dL
Ketones, ur: NEGATIVE mg/dL
Leukocytes,Ua: NEGATIVE
Nitrite: NEGATIVE
Protein, ur: NEGATIVE mg/dL
Specific Gravity, Urine: >1.03 — ABNORMAL HIGH (ref 1.005–1.030)
pH: 5 (ref 5.0–8.0)

## 2019-02-16 LAB — URINALYSIS, MICROSCOPIC (REFLEX)

## 2019-02-16 MED ORDER — ONDANSETRON HCL 4 MG/2ML IJ SOLN
4.0000 mg | Freq: Once | INTRAMUSCULAR | Status: AC
  Administered 2019-02-16: 06:00:00 4 mg via INTRAVENOUS

## 2019-02-16 MED ORDER — TAMSULOSIN HCL 0.4 MG PO CAPS: 0.4000 mg | ORAL_CAPSULE | Freq: Every day | ORAL | 0 refills | Status: DC

## 2019-02-16 MED ORDER — FENTANYL CITRATE (PF) 100 MCG/2ML IJ SOLN
100.0000 ug | Freq: Once | INTRAMUSCULAR | Status: AC
  Administered 2019-02-16: 100 ug via INTRAVENOUS
  Filled 2019-02-16: qty 2

## 2019-02-16 MED ORDER — OXYCODONE-ACETAMINOPHEN 5-325 MG PO TABS: 2.0000 | ORAL_TABLET | Freq: Four times a day (QID) | ORAL | 0 refills | Status: DC | PRN

## 2019-02-16 MED ORDER — FENTANYL CITRATE (PF) 100 MCG/2ML IJ SOLN
100.0000 ug | Freq: Once | INTRAMUSCULAR | Status: AC
  Administered 2019-02-16: 06:00:00 100 ug via INTRAVENOUS
  Filled 2019-02-16: qty 2

## 2019-02-16 MED ORDER — ONDANSETRON HCL 4 MG/2ML IJ SOLN
INTRAMUSCULAR | Status: AC
  Administered 2019-02-16: 4 mg via INTRAVENOUS
  Filled 2019-02-16: qty 2

## 2019-02-16 NOTE — ED Provider Notes (Signed)
[place] DEPT [facility] Provider Note: Georgena Spurling, MD, FACEP  CSN: [id number] MRN: [id number] ARRIVAL: 02/16/19 at 0557 ROOM: MHOTF/OTF   CHIEF COMPLAINT  Abdominal Pain   HISTORY OF PRESENT ILLNESS  02/16/19 6:09 AM Erika Russell is a 73 y.o. female with a history of kidney stones.  She is here with right flank pain radiating to her right lower quadrant that began about 3 AM today.  She rates her pain is a 10 out of 10.  She is not sure if it is like previous kidney stones.  It is not significantly changed with movement or palpation.  She has had associated nausea and vomiting.  She has not noted blood in her urine.   Past Medical History:  Diagnosis Date   Cervical dysplasia 1988   Colitis    collagenous   Elevated cholesterol    Heart block    Hypertension    IBS (irritable bowel syndrome)    Kidney stone    Osteopenia 11/2018   T score -1.8 FRAX 4.7% / 1.1%.  Overall stable from prior DEXA    Past Surgical History:  Procedure Laterality Date   APPENDECTOMY     CARDIAC STINT  09   CYSTOSCOPY/URETEROSCOPY/HOLMIUM LASER/STENT PLACEMENT Right 12/13/2017   Procedure: RIGHT URETEROSCOPY WITH RETROGRADE PYLEOGRAM/HOLMIUM LASER/STENT PLACEMENT;  Surgeon: Lucas Mallow, MD;  Location: [facility];  Service: Urology;  Laterality: Right;   GYNECOLOGIC CRYOSURGERY      Family History  Problem Relation Age of Onset   Cancer Father        COLON   Diabetes Father    Breast cancer Sister        Age 15's    Social History   Tobacco Use   Smoking status: Current Some Day Smoker    Types: Cigarettes   Smokeless tobacco: Never Used  Substance Use Topics   Alcohol use: Yes    Alcohol/week: 7.0 standard drinks    Types: 7 Standard drinks or equivalent per week    Comment: red wine x 1 nightly   Drug use: No    Prior to Admission medications   Medication Sig Start Date End Date Taking? Authorizing Provider  amLODipine (NORVASC) 5 MG tablet  Take 5 mg by mouth daily.    [provider]  aspirin 81 MG tablet Take 81 mg by mouth daily.    [provider]  atorvastatin (LIPITOR) 40 MG tablet Take 20 mg by mouth daily.     [provider]  cetirizine (ZYRTEC) 10 MG tablet Take 10 mg by mouth daily as needed for allergies.     [provider]  dexlansoprazole (DEXILANT) 60 MG capsule Take 60 mg by mouth daily.     [provider]  fluconazole (DIFLUCAN) 200 MG tablet Take 1 tablet today and another in 72 hours. Patient not taking: Reported on 11/23/2018 03/27/18   Margette Fast, MD  fluticasone Asencion Islam) 50 MCG/ACT nasal spray 1 spray each nares bid Patient not taking: Reported on 11/23/2018 10/06/13   Tanna Furry, MD  glycopyrrolate (ROBINUL) 2 MG tablet Take 2 mg by mouth 3 (three) times daily as needed (for colitis).     [provider]  magnesium oxide (MAG-OX) 400 MG tablet Take 800 mg by mouth daily.    [provider]  metoprolol succinate (TOPROL XL) 50 MG 24 hr tablet Take 50 mg by mouth 2 (two) times daily.     [provider]  Multiple  Vitamin (MULTIVITAMIN) tablet Take 1 tablet by mouth daily.      [provider]  nystatin-triamcinolone ointment (MYCOLOG) Apply 1 application topically 2 (two) times daily. 11/23/18   [name], [name], MD  olmesartan (BENICAR) 40 MG tablet Take 40 mg by mouth daily.    [provider]  Omega-3 Fatty Acids (FISH OIL PO) Take 1 capsule by mouth daily.    [provider]  ondansetron (ZOFRAN ODT) 4 MG disintegrating tablet Take 1 tablet (4 mg total) by mouth every 8 (eight) hours as needed for nausea or vomiting. 12/06/17   Little, Wenda Overland, MD  oxyCODONE-acetaminophen (PERCOCET/ROXICET) 5-325 MG tablet Take 2 tablets by mouth every 6 (six) hours as needed for severe pain. 02/16/19   Hayden Rasmussen, MD  potassium chloride SA (K-DUR,KLOR-CON) 20 MEQ tablet Take 2 tablets (40 mEq total) by mouth  daily. 06/05/17   [name], PA-C  Probiotic Product (RESTORA PO) Take 1 capsule by mouth daily.    [provider]  sodium chloride (OCEAN) 0.65 % SOLN nasal spray Place 2 sprays into both nostrils as needed for congestion.    [provider]  tamsulosin (FLOMAX) 0.4 MG CAPS capsule Take 1 capsule (0.4 mg total) by mouth daily. 02/16/19   Hayden Rasmussen, MD    Allergies Codeine and Pyridium [phenazopyridine]   REVIEW OF SYSTEMS  Negative except as noted here or in the History of Present Illness.   PHYSICAL EXAMINATION  Initial Vital Signs Blood pressure (!) 185/77, pulse 89, temperature 98.3 F (36.8 C), temperature source Oral, resp. rate 20, height 5\' 3"  (1.6 m), weight 61.2 kg, SpO2 95 %.  Examination General: Well-developed, well-nourished female in no acute distress; appearance consistent with age of record HENT: normocephalic; atraumatic Eyes: pupils equal, round and reactive to light; extraocular muscles intact Neck: supple Heart: regular rate and rhythm Lungs: clear to auscultation bilaterally Abdomen: soft; nondistended; nontender; bowel sounds present GU: Mild right CVA tenderness Extremities: No deformity; full range of motion; pulses normal Neurologic: Awake, alert and oriented; motor function intact in all extremities and symmetric; no facial droop Skin: Warm and dry Psychiatric: Flat affect   RESULTS  Summary of this visit's results, reviewed by myself:   EKG Interpretation  Date/Time:    Ventricular Rate:    PR Interval:    QRS Duration:   QT Interval:    QTC Calculation:   R Axis:     Text Interpretation:        Laboratory Studies: Results for orders placed or performed during the hospital encounter of 02/16/19 (from the past 24 hour(s))  Urinalysis, Routine w reflex microscopic     Status: Abnormal   Collection Time: 02/16/19  6:10 AM  Result Value Ref Range   Color, Urine YELLOW YELLOW   APPearance CLOUDY (A) CLEAR    Specific Gravity, Urine >1.030 (H) 1.005 - 1.030   pH 5.0 5.0 - 8.0   Glucose, UA NEGATIVE NEGATIVE mg/dL   Hgb urine dipstick LARGE (A) NEGATIVE   Bilirubin Urine NEGATIVE NEGATIVE   Ketones, ur NEGATIVE NEGATIVE mg/dL   Protein, ur NEGATIVE NEGATIVE mg/dL   Nitrite NEGATIVE NEGATIVE   Leukocytes,Ua NEGATIVE NEGATIVE  Urinalysis, Microscopic (reflex)     Status: Abnormal   Collection Time: 02/16/19  6:10 AM  Result Value Ref Range   RBC / HPF 21-50 0 - 5 RBC/hpf   WBC, UA 0-5 0 - 5 WBC/hpf   Bacteria, UA MANY (A) NONE SEEN  Squamous Epithelial / LPF 6-10 0 - 5   Uric Acid Crys, UA PRESENT   CBC with Differential/Platelet     Status: Abnormal   Collection Time: 02/16/19  6:20 AM  Result Value Ref Range   WBC 9.6 4.0 - 10.5 K/uL   RBC 5.40 (H) 3.87 - 5.11 MIL/uL   Hemoglobin 11.5 (L) 12.0 - 15.0 g/dL   HCT 36.5 36.0 - 46.0 %   MCV 67.6 (L) 80.0 - 100.0 fL   MCH 21.3 (L) 26.0 - 34.0 pg   MCHC 31.5 30.0 - 36.0 g/dL   RDW 15.5 11.5 - 15.5 %   Platelets 215 150 - 400 K/uL   nRBC 0.0 0.0 - 0.2 %   Neutrophils Relative % 65 %   Neutro Abs 6.2 1.7 - 7.7 K/uL   Lymphocytes Relative 28 %   Lymphs Abs 2.7 0.7 - 4.0 K/uL   Monocytes Relative 7 %   Monocytes Absolute 0.7 0.1 - 1.0 K/uL   Eosinophils Relative 0 %   Eosinophils Absolute 0.0 0.0 - 0.5 K/uL   Basophils Relative 0 %   Basophils Absolute 0.0 0.0 - 0.1 K/uL   WBC Morphology MORPHOLOGY UNREMARKABLE    Smear Review Normal platelet morphology    Immature Granulocytes 0 %   Abs Immature Granulocytes 0.04 0.00 - 0.07 K/uL   Schistocytes PRESENT    Tear Drop Cells PRESENT    Target Cells PRESENT   Basic metabolic panel     Status: Abnormal   Collection Time: 02/16/19  6:20 AM  Result Value Ref Range   Sodium 138 135 - 145 mmol/L   Potassium 3.7 3.5 - 5.1 mmol/L   Chloride 106 98 - 111 mmol/L   CO2 19 (L) 22 - 32 mmol/L   Glucose, Bld 148 (H) 70 - 99 mg/dL   BUN 13 8 - 23 mg/dL   Creatinine, Ser 0.62 0.44 - 1.00  mg/dL   Calcium 9.4 8.9 - 10.3 mg/dL   GFR calc non Af Amer >60 >60 mL/min   GFR calc Af Amer >60 >60 mL/min   Anion gap 13 5 - 15   Imaging Studies: Ct Renal Stone Study  Result Date: 02/16/2019 CLINICAL DATA:  Flank pain with stone disease suspected EXAM: CT ABDOMEN AND PELVIS WITHOUT CONTRAST TECHNIQUE: Multidetector CT imaging of the abdomen and pelvis was performed following the standard protocol without IV contrast. COMPARISON:  03/27/2018 FINDINGS: Lower chest: Mild nonspecific reticulation in the lower lungs. Coronary calcification. Fatty density along the subendocardial inferior left ventricle is chronic and attributed to prior infarct. Hepatobiliary: No focal liver abnormality.No evidence of biliary obstruction or stone. Pancreas: Unremarkable. Spleen: Unremarkable. Adrenals/Urinary Tract: Negative adrenals. Right hydroureteronephrosis, renal expansion, and perinephric stranding due to multiple distal right ureteral calculi at and below the iliac crossing. Most proximally is a 3 mm stone. More distally is a 2 cm length of calculi measuring up to 4 mm in diameter. Two small right renal calculi the level of the calices. 5 mm stone is in the right renal pelvis. Unremarkable bladder. Stomach/Bowel:  No obstruction. Appendectomy. Vascular/Lymphatic: No acute vascular abnormality. Extensive atherosclerotic calcification. No mass or adenopathy. Reproductive:Small calcified uterine fibroids. Other: No ascites or pneumoperitoneum. Musculoskeletal: No acute abnormalities. Advanced bilateral hip osteoarthritis. Lumbar spine facet degeneration with levoscoliosis. IMPRESSION: 1. Right-sided urinary obstruction from multiple distal ureteral calculi. Most proximally is a 3 mm stone at the iliac crossing. More inferiorly along the pelvic sidewall is a 2 cm length of calculi  measuring up to 4 mm in diameter. 2. Right nephrolithiasis. 3. Atherosclerosis including the coronary arteries. 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 Electronically Signed   By: Monte Fantasia M.D.   On: 02/16/2019 07:17    ED COURSE and MDM  Nursing notes and initial vitals signs, including pulse oximetry, reviewed.  Vitals:   02/16/19 0606 02/16/19 0610 02/16/19 0723 02/16/19 0839  BP:  (!) 185/77 (!) 159/70 (!) 160/70  Pulse:  89 84 83  Resp:  20 18 16   Temp:  98.3 F (36.8 C)  97.8 F (36.6 C)  TempSrc:  Oral  Oral  SpO2:  95% 96% 97%  Weight: 61.2 kg     Height: 5\' 3"  (1.6 m)       PROCEDURES    ED DIAGNOSES     ICD-10-CM   1. Ureterolithiasis  N20.1        Erika Salim, MD 02/16/19 2244

## 2019-02-16 NOTE — Discharge Instructions (Signed)
You were seen in the emergency department for right lower quadrant abdominal pain.  You had blood work urinalysis and a CAT scan that showed you have kidney stones in the tube going from the kidney to the bladder on the right side.  These may pass on their own and we are providing you with some pain medication to help with the pain.  You should drink plenty of fluids and monitor yourself for fever.  Please contact urology for close follow-up as you may end up needing a procedure to remove the stones.  If you experience a fever or uncontrolled pain or other concerns please return to the emergency department.

## 2019-02-16 NOTE — ED Provider Notes (Signed)
Signout from Dr. Florina Ou.  73 year old female with right lower quadrant abdominal pain nausea vomiting.  Has blood in her urine. Physical Exam  BP (!) 185/77   Pulse 89   Temp 98.3 F (36.8 C) (Oral)   Resp 20   Ht 5\' 3"  (1.6 m)   Wt 61.2 kg   SpO2 95%   BMI 23.91 kg/m   Physical Exam  ED Course/Procedures     Procedures  MDM  Plan is to follow-up on labs and CT KUB.  Disposition per results of testing.   Patient CT showed multiple distal ureteral stones and hydro-and perinephric stranding.  Patient states her pain had improved somewhat but is back up to an 8-1/2.  We will give her another dose of pain medicine.  Still waiting on CBC but renal function is okay.  She is seen Dr. Gloriann Loan in the past and needed a stent and she understands that this may be necessary again although hopefully we can get her home and trial of some pain medicine.      Hayden Rasmussen, MD 02/16/19 [phone]

## 2019-02-16 NOTE — ED Notes (Signed)
Patient transported to CT 

## 2019-02-16 NOTE — ED Triage Notes (Signed)
Pt c/o RLQ abd pain with 2 episodes of vomiting that started at 3 am.

## 2019-03-08 ENCOUNTER — Encounter: Payer: Self-pay | Admitting: Gynecology

## 2019-04-18 ENCOUNTER — Other Ambulatory Visit: Payer: Self-pay | Admitting: Internal Medicine

## 2019-04-18 DIAGNOSIS — Z1231 Encounter for screening mammogram for malignant neoplasm of breast: Secondary | ICD-10-CM

## 2019-06-12 ENCOUNTER — Ambulatory Visit
Admission: RE | Admit: 2019-06-12 | Discharge: 2019-06-12 | Disposition: A | Discharge status: Home / Self Care | Payer: Medicare Other | Source: Ambulatory Visit | Attending: Internal Medicine | Admitting: Internal Medicine

## 2019-06-12 ENCOUNTER — Other Ambulatory Visit: Payer: Self-pay

## 2019-06-12 DIAGNOSIS — Z1231 Encounter for screening mammogram for malignant neoplasm of breast: Secondary | ICD-10-CM

## 2019-06-24 ENCOUNTER — Ambulatory Visit: Payer: Medicare Other | Attending: Internal Medicine

## 2019-06-24 DIAGNOSIS — Z23 Encounter for immunization: Secondary | ICD-10-CM

## 2019-07-22 ENCOUNTER — Ambulatory Visit: Payer: Medicare Other

## 2019-07-29 ENCOUNTER — Ambulatory Visit: Payer: Medicare Other | Attending: Internal Medicine

## 2019-07-29 DIAGNOSIS — Z23 Encounter for immunization: Secondary | ICD-10-CM | POA: Insufficient documentation

## 2019-07-29 NOTE — Progress Notes (Signed)
   Covid-19 Vaccination Clinic  Name:  Erika Russell    MRN: [id number] DOB: 02-Mar-1946  07/29/2019  [name] was observed post Covid-19 immunization for 15 minutes without incidence. She was provided with Vaccine Information Sheet and instruction to access the V-Safe system.   [name] was instructed to call 911 with any severe reactions post vaccine: Marland Kitchen Difficulty breathing  . Swelling of your face and throat  . A fast heartbeat  . A bad rash all over your body  . Dizziness and weakness    Immunizations Administered    Name Date Dose VIS Date Route   Moderna COVID-19 Vaccine 07/29/2019  9:32 AM 0.5 mL 05/02/2019 Intramuscular   Manufacturer: Moderna   Lot: [id number]   [id number]

## 2019-08-22 ENCOUNTER — Encounter (HOSPITAL_BASED_OUTPATIENT_CLINIC_OR_DEPARTMENT_OTHER): Payer: Self-pay

## 2019-08-22 ENCOUNTER — Emergency Department (HOSPITAL_BASED_OUTPATIENT_CLINIC_OR_DEPARTMENT_OTHER)
Admission: EM | Admit: 2019-08-22 | Discharge: 2019-08-22 | Disposition: A | Discharge status: Home / Self Care | Payer: Medicare Other | Attending: Emergency Medicine | Admitting: Emergency Medicine

## 2019-08-22 ENCOUNTER — Other Ambulatory Visit: Payer: Self-pay

## 2019-08-22 DIAGNOSIS — F17210 Nicotine dependence, cigarettes, uncomplicated: Secondary | ICD-10-CM | POA: Insufficient documentation

## 2019-08-22 DIAGNOSIS — E785 Hyperlipidemia, unspecified: Secondary | ICD-10-CM | POA: Diagnosis not present

## 2019-08-22 DIAGNOSIS — N200 Calculus of kidney: Secondary | ICD-10-CM | POA: Insufficient documentation

## 2019-08-22 DIAGNOSIS — R319 Hematuria, unspecified: Secondary | ICD-10-CM | POA: Insufficient documentation

## 2019-08-22 DIAGNOSIS — I10 Essential (primary) hypertension: Secondary | ICD-10-CM | POA: Diagnosis not present

## 2019-08-22 LAB — URINALYSIS, ROUTINE W REFLEX MICROSCOPIC
Bilirubin Urine: NEGATIVE
Glucose, UA: NEGATIVE mg/dL
Ketones, ur: NEGATIVE mg/dL
Leukocytes,Ua: NEGATIVE
Nitrite: NEGATIVE
Protein, ur: NEGATIVE mg/dL
Specific Gravity, Urine: 1.01 (ref 1.005–1.030)
pH: 6 (ref 5.0–8.0)

## 2019-08-22 LAB — URINALYSIS, MICROSCOPIC (REFLEX)
RBC / HPF: >50 RBC/hpf (ref 0–5)
WBC, UA: NONE SEEN WBC/hpf (ref 0–5)

## 2019-08-22 MED ORDER — TAMSULOSIN HCL 0.4 MG PO CAPS: 0.4000 mg | ORAL_CAPSULE | Freq: Every day | ORAL | 0 refills | Status: DC

## 2019-08-22 MED ORDER — OXYCODONE-ACETAMINOPHEN 5-325 MG PO TABS: 2.0000 | ORAL_TABLET | Freq: Four times a day (QID) | ORAL | 0 refills | Status: DC | PRN

## 2019-08-22 NOTE — Discharge Instructions (Addendum)
You were evaluated in the Emergency Department and after careful evaluation, we did not find any emergent condition requiring admission or further testing in the hospital.  Your exam/testing today is overall reassuring.  Your symptoms seem consistent with a kidney stone.  Please use Tylenol 1000 mg every 4-6 hours for pain.  You can use the oxycodone medication for more significant pain.  We also recommend the tamsulosin medication to help you pass the stone.  Please keep your follow-up with your nephrologist next week.  Please return to the Emergency Department if you experience any worsening of your condition or fever.  We encourage you to follow up with a primary care provider.  Thank you for allowing [place] to be a part of your care.

## 2019-08-22 NOTE — ED Triage Notes (Signed)
Pt states that she has had a slight discomfort in her lower right belly for about a week and noticed blood in her urine yesterday.

## 2019-08-22 NOTE — ED Provider Notes (Signed)
[facility] Emergency Department Provider Note MRN:  [id number]  Arrival date & time: 08/22/19     Chief Complaint   Hematuria   History of Present Illness   Erika Russell is a 74 y.o. year-old female with a history of kidney stones presenting to the ED with chief complaint of hematuria.  Intermittent right flank and lower belly pain for the past week.  Noticing hematuria yesterday and today.  Denies fever, currently without abdominal pain, no chest pain, no shortness of breath, no burning with urination, mild increased frequency.  Symptoms mild.  Review of Systems  A complete 10 system review of systems was obtained and all systems are negative except as noted in the HPI and PMH.   Patient's Health History    Past Medical History:  Diagnosis Date  . Cervical dysplasia 1988  . Colitis    collagenous  . Elevated cholesterol   . Heart block   . Hypertension   . IBS (irritable bowel syndrome)   . Kidney stone   . Osteopenia 11/2018   T score -1.8 FRAX 4.7% / 1.1%.  Overall stable from prior DEXA    Past Surgical History:  Procedure Laterality Date  . APPENDECTOMY    . CARDIAC STINT  09  . CYSTOSCOPY/URETEROSCOPY/HOLMIUM LASER/STENT PLACEMENT Right 12/13/2017   Procedure: RIGHT URETEROSCOPY WITH RETROGRADE PYLEOGRAM/HOLMIUM LASER/STENT PLACEMENT;  Surgeon: Lucas Mallow, MD;  Location: [facility];  Service: Urology;  Laterality: Right;  . GYNECOLOGIC CRYOSURGERY      Family History  Problem Relation Age of Onset  . Cancer Father        COLON  . Diabetes Father   . Breast cancer Sister        Age 27's    Social History   Socioeconomic History  . Marital status: Widowed    Spouse name: Not on file  . Number of children: Not on file  . Years of education: Not on file  . Highest education level: Not on file  Occupational History  . Not on file  Tobacco Use  . Smoking status: Current Some Day Smoker    Types: Cigarettes  .  Smokeless tobacco: Never Used  Substance and Sexual Activity  . Alcohol use: Yes    Alcohol/week: 7.0 standard drinks    Types: 7 Standard drinks or equivalent per week    Comment: red wine x 1 nightly  . Drug use: No  . Sexual activity: Not Currently    Birth control/protection: Post-menopausal    Comment: 1st intercourse 53 yo-5 partners  Other Topics Concern  . Not on file  Social History Narrative  . Not on file   Social Determinants of Health   Financial Resource Strain:   . Difficulty of Paying Living Expenses:   Food Insecurity:   . Worried About Charity fundraiser in the Last Year:   . Arboriculturist in the Last Year:   Transportation Needs:   . Film/video editor (Medical):   Marland Kitchen Lack of Transportation (Non-Medical):   Physical Activity:   . Days of Exercise per Week:   . Minutes of Exercise per Session:   Stress:   . Feeling of Stress :   Social Connections:   . Frequency of Communication with Friends and Family:   . Frequency of Social Gatherings with Friends and Family:   . Attends Religious Services:   . Active Member of Clubs or Organizations:   . Attends Club  or Organization Meetings:   Marland Kitchen Marital Status:   Intimate Partner Violence:   . Fear of Current or Ex-Partner:   . Emotionally Abused:   Marland Kitchen Physically Abused:   . Sexually Abused:      Physical Exam   Vitals:   08/22/19 0846  BP: (!) 180/72  Pulse: 87  Resp: 16  Temp: 98.3 F (36.8 C)  SpO2: 100%    CONSTITUTIONAL: Well-appearing, NAD NEURO:  Alert and oriented x 3, no focal deficits EYES:  eyes equal and reactive ENT/NECK:  no LAD, no JVD CARDIO: Regular rate, well-perfused, normal S1 and S2 PULM:  CTAB no wheezing or rhonchi GI/GU:  normal bowel sounds, non-distended, non-tender, no McBurney's point tenderness, minimal right CVA tenderness MSK/SPINE:  No gross deformities, no edema SKIN:  no rash, atraumatic PSYCH:  Appropriate speech and behavior  *Additional and/or pertinent  findings included in MDM below  Diagnostic and Interventional Summary    EKG Interpretation  Date/Time:    Ventricular Rate:    PR Interval:    QRS Duration:   QT Interval:    QTC Calculation:   R Axis:     Text Interpretation:        Labs Reviewed  URINALYSIS, ROUTINE W REFLEX MICROSCOPIC - Abnormal; Notable for the following components:      Result Value   APPearance HAZY (*)    Hgb urine dipstick LARGE (*)    All other components within normal limits  URINALYSIS, MICROSCOPIC (REFLEX) - Abnormal; Notable for the following components:   Bacteria, UA FEW (*)    All other components within normal limits  URINE CULTURE    No orders to display    Medications - No data to display   Procedures  /  Critical Care Procedures  ED Course and Medical Decision Making  I have reviewed the triage vital signs, the nursing notes, and pertinent available records from the EMR.  Pertinent labs & imaging results that were available during my care of the patient were reviewed by me and considered in my medical decision making (see below for details).     Hematuria with right flank pain, known history of kidney stones, recent imaging showing a few stones in the right renal pelvis.  Suspect uncomplicated kidney stone given the hematuria.  No fever, no signs or symptoms of systemic infection or pyelonephritis.  Will evaluate the urine for signs of infection.  Without infection, I feel the patient is appropriate for management at home with Tylenol, will likely pass stone on her own.  I do not see an indication for imaging at this time as patient is without pain and we have very little diagnostic uncertainty.  Exam is inconsistent with appendicitis or diverticulitis, nontender.  With signs of infection in the urinalysis, would expand the work-up with labs and CT scan to evaluate for stone and then likely discuss with urology if we have concerns for kidney stone with infection.  10:20 AM update:  Urinalysis is leuk negative, nitrite negative, no white blood cells, doubt infection.  All seems consistent with kidney stone with well-controlled pain.  Appropriate for medical management follow-up with urology, which she already has scheduled next week.  Barth Kirks. [place]onia Small, [place] [email or web address]  Final Clinical Impressions(s) / ED Diagnoses     ICD-10-CM   1. Hematuria, unspecified type  R31.9   2. Kidney stone  N20.0     ED Discharge Orders  Ordered    tamsulosin (FLOMAX) 0.4 MG CAPS capsule  Daily     08/22/19 1026    oxyCODONE-acetaminophen (PERCOCET/ROXICET) 5-325 MG tablet  Every 6 hours PRN     08/22/19 1026           Discharge Instructions Discussed with and Provided to Patient:     Discharge Instructions     You were evaluated in the Emergency Department and after careful evaluation, we did not find any emergent condition requiring admission or further testing in the hospital.  Your exam/testing today is overall reassuring.  Your symptoms seem consistent with a kidney stone.  Please use Tylenol 1000 mg every 4-6 hours for pain.  You can use the oxycodone medication for more significant pain.  We also recommend the tamsulosin medication to help you pass the stone.  Please keep your follow-up with your nephrologist next week.  Please return to the Emergency Department if you experience any worsening of your condition or fever.  We encourage you to follow up with a primary care provider.  Thank you for allowing [place] to be a part of your care.       Maudie Flakes, MD 08/22/19 1028

## 2019-08-22 NOTE — ED Notes (Signed)
Lab notified for add on urine culture.

## 2019-08-23 LAB — URINE CULTURE: Culture: NO GROWTH

## 2019-08-25 ENCOUNTER — Other Ambulatory Visit: Payer: Self-pay

## 2019-08-28 ENCOUNTER — Ambulatory Visit (INDEPENDENT_AMBULATORY_CARE_PROVIDER_SITE_OTHER): Payer: Medicare Other | Admitting: Obstetrics and Gynecology

## 2019-08-28 ENCOUNTER — Encounter: Payer: Self-pay | Admitting: Obstetrics and Gynecology

## 2019-08-28 ENCOUNTER — Other Ambulatory Visit: Payer: Self-pay

## 2019-08-28 VITALS — BP 124/80

## 2019-08-28 DIAGNOSIS — N950 Postmenopausal bleeding: Secondary | ICD-10-CM

## 2019-08-28 NOTE — Progress Notes (Signed)
   MAYERLIN DORNER  07-14-1945 [id number]  HPI The patient is a 74 y.o. [id number] who presents today for evaluation of vaginal spotting on a few episodes in the past few weeks.  She says that she has noticed this when urinating and looking in the toilet afterwards she does see some blood.  She has not had any heavy bleeding.  She has had a chronic kidney stone on the right side and she says during her recent urology evaluation for the cause of the increased flank pain and hematuria on her recent UAs, on CT she was noted to have fibroids in her uterus.  We do not have the imaging reports available for review.  Urine culture from 08/22/2019 was negative, but the UA did indicate numerous RBC.  Past medical history,surgical history, problem list, medications, allergies, family history and social history were all reviewed and documented as reviewed in the EPIC chart.  ROS:  Feeling well. No dyspnea or chest pain on exertion. + right abdominal/flank pain, no change in bowel habits, black or bloody stools.  No urinary tract symptoms. GYN ROS: see HPI  Physical Exam  BP 124/80   General: Pleasant female, no acute distress, alert and oriented  PELVIC EXAM: VULVA: normal appearing vulva with no masses, tenderness or lesions, VAGINA: normal appearing vagina with normal color and discharge, no lesions, CERVIX: normal appearing cervix without discharge or lesions, UTERUS: uterus is normal size, shape, consistency and nontender, ADNEXA: normal adnexa in size, nontender and no masses, RECTAL: external hemorrhoids non-thrombosed  Caryn Bee present for examination  Assessment 74 yo G3 P2 with postmenopausal bleeding  Plan No source of bleeding is identified on today's vaginal exam.  We discussed that we need to evaluate the uterus to rule out endometrial hyperplasia or cancer.  This should be done with a sonohysterogram which I have ordered and she will plan to get this scheduled.  Joseph Pierini MD, FACOG  08/28/19

## 2019-09-06 ENCOUNTER — Other Ambulatory Visit: Payer: Self-pay

## 2019-09-07 ENCOUNTER — Other Ambulatory Visit: Payer: Medicare Other

## 2019-09-07 ENCOUNTER — Ambulatory Visit (INDEPENDENT_AMBULATORY_CARE_PROVIDER_SITE_OTHER): Payer: Medicare Other | Admitting: Obstetrics and Gynecology

## 2019-09-07 ENCOUNTER — Ambulatory Visit: Payer: Medicare Other | Admitting: Obstetrics and Gynecology

## 2019-09-07 ENCOUNTER — Encounter: Payer: Self-pay | Admitting: Obstetrics and Gynecology

## 2019-09-07 ENCOUNTER — Other Ambulatory Visit: Payer: Self-pay | Admitting: Obstetrics and Gynecology

## 2019-09-07 ENCOUNTER — Ambulatory Visit (INDEPENDENT_AMBULATORY_CARE_PROVIDER_SITE_OTHER): Payer: Medicare Other

## 2019-09-07 DIAGNOSIS — D259 Leiomyoma of uterus, unspecified: Secondary | ICD-10-CM | POA: Insufficient documentation

## 2019-09-07 DIAGNOSIS — N950 Postmenopausal bleeding: Secondary | ICD-10-CM

## 2019-09-07 NOTE — Progress Notes (Signed)
   Erika Russell  20-Oct-1945 [id number]  HPI The patient is a 74 y.o. [id number] who presents today for a pelvic ultrasound to evaluate her uterus since she was having postmenopausal bleeding.  She has not had any further bleeding since the last encounter.  Physical Exam  There were no vitals taken for this visit.  General: Pleasant female, no acute distress, alert and oriented PELVIC EXAM: deferred as this was performed in her last exam  Pelvic ultrasound Anteverted uterus, 5.0 x 5.7 x 2.5 cm 2 pedunculated calcified fibroids near the right adnexa measuring 2.3 cm, 1.8 cm. Symmetrical endometrial thickness 1.7 mm. Bilateral ovaries small and atrophic. No adnexal masses.  No free fluid.  Assessment 74 yo G3P2 with a transient episode of postmenopausal bleeding, asymptomatic pedunculated small fibroids  Plan The patient is reassured that her endometrium is very thin which is expected in menopause.  Highly unlikely that an endometrial source such as hyperplasia or carcinoma would be a cause for the recent postmenopausal bleeding episode, which itself was self-limited.  No further bleeding at this time.  No need for follow-up of asymptomatic fibroids in menopause.  I recommend that she monitor for recurrence of the symptoms to let us know if that were to occur.   Joseph Pierini MD, FACOG 09/07/19

## 2019-12-19 NOTE — Patient Instructions (Addendum)
DUE TO COVID-19 ONLY ONE VISITOR IS ALLOWED TO COME WITH YOU AND STAY IN THE WAITING ROOM ONLY DURING PRE OP AND PROCEDURE DAY OF SURGERY. THE 2 VISITORS  MAY VISIT WITH YOU AFTER SURGERY IN YOUR PRIVATE ROOM DURING VISITING HOURS ONLY!  YOU NEED TO HAVE A COVID 19 TEST ON_7/23______ @_2 :35______, THIS TEST MUST BE DONE BEFORE SURGERY, COME  [address] , [id number].  ([place]) ONCE YOUR COVID TEST IS COMPLETED, PLEASE BEGIN THE QUARANTINE INSTRUCTIONS AS OUTLINED IN YOUR HANDOUT.                Erika Russell    Your procedure is scheduled on: 12/26/19   Report to [facility] Main  Entrance   Report to admitting at  11:50AM     Call this number if you have problems the morning of surgery [place], NO CHEWING GUM CANDY OR MINTS.   Do not eat food After Midnight.    YOU MAY HAVE CLEAR LIQUIDS FROM MIDNIGHT UNTIL 11:00 AM.  CLEAR LIQUID DIET   Foods Allowed                                                                     Foods Excluded  Coffee and tea, regular and decaf                             liquids that you cannot  Plain Jell-O any favor except red or purple                                           see through such as: Fruit ices (not with fruit pulp)                                     milk, soups, orange juice  Iced Popsicles                                    All solid food Carbonated beverages, regular and diet                                    Cranberry, grape and apple juices Sports drinks like Gatorade Lightly seasoned clear broth or consume(fat free) Sugar, honey syrup _____________________________________________________________________    At 11:00 AM Please finish the prescribed Pre-Surgery  drink.   Nothing by mouth after you finish the  drink !    Take these medicines the morning of surgery with A SIP OF WATER:   Zyrtec, Metoprolol,  Amlodipine, Prilosec                                 You may not have any metal on  your body including hair pins and              piercings  Do not wear jewelry, make-up, lotions, powders or perfumes, deodorant             Do not wear nail polish on your fingernails.             Do not shave  48 hours prior to surgery.               Do not bring valuables to the hospital. [place].  Contacts, dentures or bridgework may not be worn into surgery.       [facility] - Preparing for Surgery  Before surgery, you can play an important role.   Because skin is not sterile, your skin needs to be as free of germs as possible.   You can reduce the number of germs on your skin by washing with CHG (chlorahexidine gluconate) soap before surgery.   CHG is an antiseptic cleaner which kills germs and bonds with the skin to continue killing germs even after washing. Please DO NOT use if you have an allergy to CHG or antibacterial soaps.   If your skin becomes reddened/irritated stop using the CHG and inform your nurse when you arrive at Short Stay. Do not shave (including legs and underarms) for at least 48 hours prior to the first CHG shower.    Please follow these instructions carefully:  1.  Shower with CHG Soap the night before surgery and the  morning of Surgery.  2.  If you choose to wash your hair, wash your hair first as usual with your  normal  shampoo.  3.  After you shampoo, rinse your hair and body thoroughly to remove the  shampoo.                                         4.  Use CHG as you would any other liquid soap.  You can apply chg directly  to the skin and wash                       Gently with a scrungie or clean washcloth.  5.  Apply the CHG Soap to your body ONLY FROM THE NECK DOWN.   Do not use on face/ open                           Wound or open sores. Avoid contact with eyes, ears mouth and genitals (private parts).                        Wash face,  Genitals (private parts) with your normal soap.             6.  Wash thoroughly, paying special attention to the area where your surgery  will be performed.  7.  Thoroughly rinse your body with warm water from the neck down.  8.  DO NOT shower/wash with your normal soap after using and rinsing off  the CHG Soap.             9.  Pat yourself dry with a clean towel.  10.  Wear clean pajamas.            11.  Place clean sheets on your bed the night of your first shower and do not  sleep with pets. Day of Surgery : Do not apply any lotions/deodorants the morning of surgery.  Please wear clean clothes to the hospital/surgery center.  FAILURE TO FOLLOW THESE INSTRUCTIONS MAY RESULT IN THE CANCELLATION OF YOUR SURGERY PATIENT SIGNATURE_________________________________  NURSE SIGNATURE__________________________________  ________________________________________________________________________   Adam Phenix  An incentive spirometer is a tool that can help keep your lungs clear and active. This tool measures how well you are filling your lungs with each breath. Taking long deep breaths may help reverse or decrease the chance of developing breathing (pulmonary) problems (especially infection) following:  A long period of time when you are unable to move or be active. BEFORE THE PROCEDURE   If the spirometer includes an indicator to show your best effort, your nurse or respiratory therapist will set it to a desired goal.  If possible, sit up straight or lean slightly forward. Try not to slouch.  Hold the incentive spirometer in an upright position. INSTRUCTIONS FOR USE  1. Sit on the edge of your bed if possible, or sit up as far as you can in bed or on a chair. 2. Hold the incentive spirometer in an upright position. 3. Breathe out normally. 4. Place the mouthpiece in your mouth and seal your lips tightly around it. 5. Breathe in slowly and as deeply  as possible, raising the piston or the ball toward the top of the column. 6. Hold your breath for 3-5 seconds or for as long as possible. Allow the piston or ball to fall to the bottom of the column. 7. Remove the mouthpiece from your mouth and breathe out normally. 8. Rest for a few seconds and repeat Steps 1 through 7 at least 10 times every 1-2 hours when you are awake. Take your time and take a few normal breaths between deep breaths. 9. The spirometer may include an indicator to show your best effort. Use the indicator as a goal to work toward during each repetition. 10. After each set of 10 deep breaths, practice coughing to be sure your lungs are clear. If you have an incision (the cut made at the time of surgery), support your incision when coughing by placing a pillow or rolled up towels firmly against it. Once you are able to get out of bed, walk around indoors and cough well. You may stop using the incentive spirometer when instructed by your caregiver.  RISKS AND COMPLICATIONS  Take your time so you do not get dizzy or light-headed.  If you are in pain, you may need to take or ask for pain medication before doing incentive spirometry. It is harder to take a deep breath if you are having pain. AFTER USE  Rest and breathe slowly and easily.  It can be helpful to keep track of a log of your progress. Your caregiver can provide you with a simple table to help with this. If you are using the spirometer at home, follow these instructions: [place] IF:   You are having difficultly using the spirometer.  You have trouble using the spirometer as often as instructed.  Your pain medication is not giving enough relief while using the spirometer.  You develop fever of 100.5 F (38.1 C) or higher. SEEK IMMEDIATE MEDICAL CARE IF:   You cough up bloody sputum  that had not been present before.  You develop fever of 102 F (38.9 C) or greater.  You develop worsening pain at or  near the incision site. MAKE SURE YOU:   Understand these instructions.  Will watch your condition.  Will get help right away if you are not doing well or get worse. Document Released: 09/28/2006 Document Revised: 08/10/2011 Document Reviewed: 11/29/2006 [facility] Patient Information 2014 [place], [place].   ________________________________________________________________________

## 2019-12-21 ENCOUNTER — Encounter (HOSPITAL_COMMUNITY)
Admission: RE | Admit: 2019-12-21 | Discharge: 2019-12-21 | Disposition: A | Discharge status: Home / Self Care | Payer: Medicare Other | Source: Ambulatory Visit | Attending: Orthopedic Surgery | Admitting: Orthopedic Surgery

## 2019-12-21 ENCOUNTER — Other Ambulatory Visit: Payer: Self-pay

## 2019-12-21 ENCOUNTER — Encounter (HOSPITAL_COMMUNITY): Payer: Self-pay

## 2019-12-21 DIAGNOSIS — I252 Old myocardial infarction: Secondary | ICD-10-CM | POA: Insufficient documentation

## 2019-12-21 DIAGNOSIS — I10 Essential (primary) hypertension: Secondary | ICD-10-CM | POA: Diagnosis not present

## 2019-12-21 DIAGNOSIS — Z01818 Encounter for other preprocedural examination: Secondary | ICD-10-CM | POA: Diagnosis not present

## 2019-12-21 HISTORY — DX: Unspecified osteoarthritis, unspecified site: M19.90

## 2019-12-21 HISTORY — DX: Atherosclerotic heart disease of native coronary artery without angina pectoris: I25.10

## 2019-12-21 HISTORY — DX: Acute myocardial infarction, unspecified: I21.9

## 2019-12-21 LAB — SURGICAL PCR SCREEN
MRSA, PCR: NEGATIVE
Staphylococcus aureus: NEGATIVE

## 2019-12-21 LAB — BASIC METABOLIC PANEL
Anion gap: 9 (ref 5–15)
BUN: 13 mg/dL (ref 8–23)
CO2: 26 mmol/L (ref 22–32)
Calcium: 8.5 mg/dL — ABNORMAL LOW (ref 8.9–10.3)
Chloride: 105 mmol/L (ref 98–111)
Creatinine, Ser: 0.73 mg/dL (ref 0.44–1.00)
GFR calc Af Amer: >60 mL/min (ref >60)
GFR calc non Af Amer: >60 mL/min (ref >60)
Glucose, Bld: 101 mg/dL — ABNORMAL HIGH (ref 70–99)
Potassium: 3.9 mmol/L (ref 3.5–5.1)
Sodium: 140 mmol/L (ref 135–145)

## 2019-12-21 LAB — CBC
HCT: 36.3 % (ref 36.0–46.0)
Hemoglobin: 11.5 g/dL — ABNORMAL LOW (ref 12.0–15.0)
MCH: 21.7 pg — ABNORMAL LOW (ref 26.0–34.0)
MCHC: 31.7 g/dL (ref 30.0–36.0)
MCV: 68.4 fL — ABNORMAL LOW (ref 80.0–100.0)
Platelets: 207 10*3/uL (ref 150–400)
RBC: 5.31 MIL/uL — ABNORMAL HIGH (ref 3.87–5.11)
RDW: 15.6 % — ABNORMAL HIGH (ref 11.5–15.5)
WBC: 6.3 10*3/uL (ref 4.0–10.5)
nRBC: 0 % (ref 0.0–0.2)

## 2019-12-21 NOTE — Progress Notes (Signed)
COVID Vaccine Completed:yes Date COVID Vaccine completed:07/22/19 COVID vaccine manufacturer:  Moderna    PCP - Dr. Nevada Crane Cardiologist - Dr. Ethelda Chick  Chest x-ray - no EKG - 12/21/19 Stress Test - no ECHO - no Cardiac Cath - 2009  Sleep Study - no CPAP -   Fasting Blood Sugar - NA Checks Blood Sugar _____ times a day  Blood Thinner Instructions:ASA/ Dr. Mellody Drown Aspirin Instructions:stop 7 days prior to DOS/ Dr. Alvan Dame Last Dose:12/18/19  Anesthesia review:   Patient denies shortness of breath, fever, cough and chest pain at PAT appointment Yes   Patient verbalized understanding of instructions that were given to them at the PAT appointment. Patient was also instructed that they will need to review over the PAT instructions again at home before surgery. Yes  Pt walks daily but has not been walking lately because of pain. She doesn't climb stairs and has no SOB while doing housework or ADLs.

## 2019-12-22 ENCOUNTER — Other Ambulatory Visit (HOSPITAL_COMMUNITY)
Admission: RE | Admit: 2019-12-22 | Discharge: 2019-12-22 | Disposition: A | Discharge status: Home / Self Care | Payer: Medicare Other | Source: Ambulatory Visit | Attending: Orthopedic Surgery | Admitting: Orthopedic Surgery

## 2019-12-22 DIAGNOSIS — Z20822 Contact with and (suspected) exposure to covid-19: Secondary | ICD-10-CM | POA: Insufficient documentation

## 2019-12-22 DIAGNOSIS — Z01812 Encounter for preprocedural laboratory examination: Secondary | ICD-10-CM | POA: Insufficient documentation

## 2019-12-22 LAB — SARS CORONAVIRUS 2 (TAT 6-24 HRS): SARS Coronavirus 2: NEGATIVE

## 2019-12-22 NOTE — Anesthesia Preprocedure Evaluation (Addendum)
Anesthesia Evaluation  Patient identified by MRN, date of birth, ID band Patient awake    Reviewed: Allergy & Precautions, NPO status , Patient's Chart, lab work & pertinent test results, reviewed documented beta blocker date and time   Airway Mallampati: III  TM Distance: >3 FB Neck ROM: Full    Dental no notable dental hx. (+) Teeth Intact, Dental Advisory Given   Pulmonary Current Smoker and Patient abstained from smoking.,    Pulmonary exam normal breath sounds clear to auscultation       Cardiovascular hypertension, Pt. on medications and Pt. on home beta blockers + CAD, + Past MI and + Cardiac Stents  Normal cardiovascular exam+ dysrhythmias  Rhythm:Regular Rate:Normal  MI, Stent RCA 2009  EKG NSR, LVH, T wave inversion V3-6, II, III, aVF unchanged from 2019   Neuro/Psych negative neurological ROS  negative psych ROS   GI/Hepatic Neg liver ROS, GERD  Medicated and Controlled,  Endo/Other  Hyperlipidemia  Renal/GU Renal diseaseHx/o renal calculi  negative genitourinary   Musculoskeletal  (+) Arthritis , Osteoarthritis,  OA left hip   Abdominal   Peds  Hematology negative hematology ROS (+)   Anesthesia Other Findings   Reproductive/Obstetrics CIN                            Anesthesia Physical Anesthesia Plan  ASA: III  Anesthesia Plan: Spinal   Post-op Pain Management:    Induction:   PONV Risk Score and Plan: 2 and Treatment may vary due to age or medical condition  Airway Management Planned: Natural Airway and Simple Face Mask  Additional Equipment:   Intra-op Plan:   Post-operative Plan:   Informed Consent: I have reviewed the patients History and Physical, chart, labs and discussed the procedure including the risks, benefits and alternatives for the proposed anesthesia with the patient or authorized representative who has indicated his/her understanding and  acceptance.     Dental advisory given  Plan Discussed with: CRNA and Anesthesiologist  Anesthesia Plan Comments: (See PAT note 12/21/2019, Konrad Felix, PA-C)       Anesthesia Quick Evaluation

## 2019-12-22 NOTE — Progress Notes (Signed)
Anesthesia Chart Review   Case: [id number] Date/Time: 12/26/19 1405   Procedure: TOTAL HIP ARTHROPLASTY ANTERIOR APPROACH (Left Hip) - 70 mins   Anesthesia type: Spinal   Pre-op diagnosis: Left hip osteoarthritis   Location: [facility] ROOM 10 / [facility]   Surgeons: Paralee Cancel, MD      DISCUSSION:73 y.o. current some day smoker (3.75 pack years) with h/o HTN, CAD (STEMI 2009 with DES to PDA), left hip OA scheduled for above procedure 12/26/2019 with Dr. Paralee Cancel.   Pt last seen by cardiology 04/17/2019.  Per OV note, "She is able to do exertion beyond 4 metabolic equivalents. In my opinion, the patient can have orthopedic surgery without stress testing and with a low risk of cardiac complications."  Anticipate pt can proceed with planned procedure barring acute status change.    VS: BP (!) 152/63   Pulse 65   Temp 37 C (Oral)   Resp 16   Ht 5\' 3"  (1.6 m)   Wt 59.4 kg   SpO2 97%   BMI 23.21 kg/m   PROVIDERS: Jilda Panda, MD is PCP   Dolan Amen, MD is Cardiologist  LABS: Labs reviewed: Acceptable for surgery. (all labs ordered are listed, but only abnormal results are displayed)  Labs Reviewed  BASIC METABOLIC PANEL - Abnormal; Notable for the following components:      Result Value   Glucose, Bld 101 (*)    Calcium 8.5 (*)    All other components within normal limits  CBC - Abnormal; Notable for the following components:   RBC 5.31 (*)    Hemoglobin 11.5 (*)    MCV 68.4 (*)    MCH 21.7 (*)    RDW 15.6 (*)    All other components within normal limits  SURGICAL PCR SCREEN  TYPE AND SCREEN     IMAGES:   EKG: 12/21/2019 Rate 66 bpm Normal sinus rhythm Possible Left atrial enlargement Left ventricular hypertrophy Cannot rule out Anterior infarct , age undetermined T wave abnormality, consider anterolateral ischemia Abnormal ECG No significant change since last tracing  CV:  Past Medical History:  Diagnosis Date  . Arthritis    lower back ,hips  .  Cervical dysplasia 1988  . Colitis    collagenous  . Coronary artery disease    blockage of RCA '09  . Elevated cholesterol   . Heart block   . History of kidney stones 2020  . Hypertension   . IBS (irritable bowel syndrome)   . Kidney stone   . Myocardial infarction ([place])   . Osteopenia 11/2018   T score -1.8 FRAX 4.7% / 1.1%.  Overall stable from prior DEXA    Past Surgical History:  Procedure Laterality Date  . APPENDECTOMY    . CARDIAC CATHETERIZATION  2009   stent  . CARDIAC STINT  09  . CYSTOSCOPY/URETEROSCOPY/HOLMIUM LASER/STENT PLACEMENT Right 12/13/2017   Procedure: RIGHT URETEROSCOPY WITH RETROGRADE PYLEOGRAM/HOLMIUM LASER/STENT PLACEMENT;  Surgeon: Lucas Mallow, MD;  Location: [facility];  Service: Urology;  Laterality: Right;  . GYNECOLOGIC CRYOSURGERY      MEDICATIONS: . amLODipine (NORVASC) 5 MG tablet  . ascorbic acid (VITAMIN C) 500 MG tablet  . aspirin EC 81 MG tablet  . atorvastatin (LIPITOR) 40 MG tablet  . cetirizine (ZYRTEC) 10 MG tablet  . cholecalciferol (VITAMIN D) 25 MCG (1000 UNIT) tablet  . cholestyramine (QUESTRAN) 4 g packet  . glycopyrrolate (ROBINUL) 2 MG tablet  . lidocaine (LIDODERM) 5 %  .  MAGNESIUM-OXIDE 400 (241.3 Mg) MG tablet  . metoprolol tartrate (LOPRESSOR) 50 MG tablet  . Multiple Vitamin (MULTIVITAMIN WITH MINERALS) TABS tablet  . nystatin-triamcinolone ointment (MYCOLOG)  . olmesartan (BENICAR) 40 MG tablet  . Omega-3 Fatty Acids (FISH OIL) 1000 MG CAPS  . omeprazole (PRILOSEC) 40 MG capsule  . oxyCODONE-acetaminophen (PERCOCET/ROXICET) 5-325 MG tablet  . potassium chloride SA (K-DUR,KLOR-CON) 20 MEQ tablet  . Probiotic Product (PROBIOTIC PO)  . RESTASIS 0.05 % ophthalmic emulsion  . sodium chloride (OCEAN) 0.65 % SOLN nasal spray  . tamsulosin (FLOMAX) 0.4 MG CAPS capsule   No current facility-administered medications for this encounter.     Konrad Felix, PA-C [facility]-Surgical Testing [phone]

## 2019-12-25 NOTE — H&P (Signed)
TOTAL HIP ADMISSION H&P  Patient is admitted for left total hip arthroplasty, anterior approach.  Subjective:  Chief Complaint:     Left hip OA / pain  HPI: Erika Russell, 74 y.o. female, has a history of pain and functional disability in the left hip(s) due to arthritis and patient has failed non-surgical conservative treatments for greater than 12 weeks to include NSAID's and/or analgesics, use of assistive devices and activity modification.  Onset of symptoms was gradual starting 1-2 years ago with gradually worsening course since that time.The patient noted no past surgery on the left hip(s).  Patient currently rates pain in the left hip at 9 out of 10 with activity. Patient has night pain, worsening of pain with activity and weight bearing, trendelenberg gait, pain that interfers with activities of daily living and pain with passive range of motion. Patient has evidence of periarticular osteophytes and joint space narrowing by imaging studies. This condition presents safety issues increasing the risk of falls.   There is no current active infection.  Risks, benefits and expectations were discussed with the patient.  Risks including but not limited to the risk of anesthesia, blood clots, nerve damage, blood vessel damage, failure of the prosthesis, infection and up to and including death.  Patient understand the risks, benefits and expectations and wishes to proceed with surgery.   PCP: Jilda Panda, MD  D/C Plans:       Home   Post-op Meds:       No Rx given  Tranexamic Acid:      To be given - IV   Decadron:      Is to be given  FYI:      ASA  Oxycodone (on -pre-op)  DME:    Rx sent for - RW & 3-n-1  PT:   HEP  Pharmacy: [facility] -  [address], [place], [place]    Patient Active Problem List   Diagnosis Date Noted  . Uterine leiomyoma 09/07/2019  . Elevated cholesterol   . Heart block   . CIN (conjunctival intraepithelial neoplasia)    Past Medical History:  Diagnosis  Date  . Arthritis    lower back ,hips  . Cervical dysplasia 1988  . Colitis    collagenous  . Coronary artery disease    blockage of RCA '09  . Elevated cholesterol   . Heart block   . History of kidney stones 2020  . Hypertension   . IBS (irritable bowel syndrome)   . Kidney stone   . Myocardial infarction ([place])   . Osteopenia 11/2018   T score -1.8 FRAX 4.7% / 1.1%.  Overall stable from prior DEXA    Past Surgical History:  Procedure Laterality Date  . APPENDECTOMY    . CARDIAC CATHETERIZATION  2009   stent  . CARDIAC STINT  09  . CYSTOSCOPY/URETEROSCOPY/HOLMIUM LASER/STENT PLACEMENT Right 12/13/2017   Procedure: RIGHT URETEROSCOPY WITH RETROGRADE PYLEOGRAM/HOLMIUM LASER/STENT PLACEMENT;  Surgeon: Lucas Mallow, MD;  Location: [facility];  Service: Urology;  Laterality: Right;  . GYNECOLOGIC CRYOSURGERY      No current facility-administered medications for this encounter.   Current Outpatient Medications  Medication Sig Dispense Refill Last Dose  . amLODipine (NORVASC) 5 MG tablet Take 5 mg by mouth daily.     Marland Kitchen ascorbic acid (VITAMIN C) 500 MG tablet Take 5,000 mg by mouth daily.     Marland Kitchen aspirin EC 81 MG tablet Take 81 mg by mouth daily. Swallow whole.     Marland Kitchen  atorvastatin (LIPITOR) 40 MG tablet Take 20 mg by mouth daily.      . cetirizine (ZYRTEC) 10 MG tablet Take 10 mg by mouth daily as needed for allergies.      . cholecalciferol (VITAMIN D) 25 MCG (1000 UNIT) tablet Take 1,000 Units by mouth daily.     . cholestyramine (QUESTRAN) 4 g packet Take 4 g by mouth 2 (two) times daily as needed (colitis).      Marland Kitchen glycopyrrolate (ROBINUL) 2 MG tablet Take 2 mg by mouth 3 (three) times daily as needed (for colitis).      Marland Kitchen lidocaine (LIDODERM) 5 % Place 1 patch onto the skin daily as needed (back pain.).      Marland Kitchen MAGNESIUM-OXIDE 400 (241.3 Mg) MG tablet Take 800 mg by mouth daily.     . metoprolol tartrate (LOPRESSOR) 50 MG tablet Take 50 mg by mouth 2 (two) times daily.     .  Multiple Vitamin (MULTIVITAMIN WITH MINERALS) TABS tablet Take 1 tablet by mouth daily.     Marland Kitchen nystatin-triamcinolone ointment (MYCOLOG) Apply 1 application topically 2 (two) times daily. (Patient taking differently: Apply 1 application topically 2 (two) times daily as needed (irritation.). ) 30 g 2   . olmesartan (BENICAR) 40 MG tablet Take 40 mg by mouth daily.     . Omega-3 Fatty Acids (FISH OIL) 1000 MG CAPS Take 1,000 mg by mouth daily.     Marland Kitchen omeprazole (PRILOSEC) 40 MG capsule Take 40 mg by mouth daily as needed (heartburn/indigestion.).     Marland Kitchen potassium chloride SA (K-DUR,KLOR-CON) 20 MEQ tablet Take 2 tablets (40 mEq total) by mouth daily. (Patient taking differently: Take 20 mEq by mouth in the morning and at bedtime. ) 14 tablet 0   . Probiotic Product (PROBIOTIC PO) Take 1 capsule by mouth daily.     . RESTASIS 0.05 % ophthalmic emulsion Place 1 drop into both eyes every 12 (twelve) hours.      . sodium chloride (OCEAN) 0.65 % SOLN nasal spray Place 2 sprays into both nostrils 4 (four) times daily as needed for congestion.      Marland Kitchen oxyCODONE-acetaminophen (PERCOCET/ROXICET) 5-325 MG tablet Take 2 tablets by mouth every 6 (six) hours as needed for severe pain. (Patient not taking: Reported on 08/28/2019) 6 tablet 0   . tamsulosin (FLOMAX) 0.4 MG CAPS capsule Take 1 capsule (0.4 mg total) by mouth daily. (Patient not taking: Reported on 12/08/2019) 10 capsule 0 Not Taking at Unknown time   Allergies  Allergen Reactions  . Codeine Itching  . Pyridium [Phenazopyridine] Nausea And Vomiting    Social History   Tobacco Use  . Smoking status: Current Some Day Smoker    Packs/day: 0.25    Years: 15.00    Pack years: 3.75    Types: Cigarettes  . Smokeless tobacco: Never Used  Substance Use Topics  . Alcohol use: Yes    Alcohol/week: 7.0 standard drinks    Types: 7 Standard drinks or equivalent per week    Comment: red wine x 1 nightly    Family History  Problem Relation Age of Onset  .  Cancer Father        COLON  . Diabetes Father   . Breast cancer Sister        Age 47's     Review of Systems  Constitutional: Negative.   HENT: Negative.   Eyes: Negative.   Respiratory: Negative.   Cardiovascular: Negative.   Gastrointestinal: Negative.  Genitourinary: Negative.   Musculoskeletal: Positive for joint pain.  Skin: Negative.   Neurological: Negative.   Endo/Heme/Allergies: Negative.   Psychiatric/Behavioral: Negative.       Objective:  Physical Exam Constitutional:      Appearance: She is well-developed.  HENT:     Head: Normocephalic.  Eyes:     Pupils: Pupils are equal, round, and reactive to light.  Neck:     Thyroid: No thyromegaly.     Vascular: No JVD.     Trachea: No tracheal deviation.  Cardiovascular:     Rate and Rhythm: Normal rate and regular rhythm.  Pulmonary:     Effort: Pulmonary effort is normal. No respiratory distress.     Breath sounds: Normal breath sounds. No wheezing.  Abdominal:     Palpations: Abdomen is soft.     Tenderness: There is no abdominal tenderness. There is no guarding.  Musculoskeletal:     Cervical back: Neck supple.     Left hip: Tenderness and bony tenderness present. Decreased range of motion. Decreased strength.  Lymphadenopathy:     Cervical: No cervical adenopathy.  Skin:    General: Skin is warm and dry.  Neurological:     Mental Status: She is alert and oriented to person, place, and time.       Labs:  Estimated body mass index is 23.21 kg/m as calculated from the following:   Height as of 12/21/19: 5\' 3"  (1.6 m).   Weight as of 12/21/19: 59.4 kg.   Imaging Review Plain radiographs demonstrate severe degenerative joint disease of the left hip. The bone quality appears to be good for age and reported activity level.      Assessment/Plan:  End stage arthritis, left hip  The patient history, physical examination, clinical judgement of the provider and imaging studies are consistent  with end stage degenerative joint disease of the left hip and total hip arthroplasty is deemed medically necessary. The treatment options including medical management, injection therapy, arthroscopy and arthroplasty were discussed at length. The risks and benefits of total hip arthroplasty were presented and reviewed. The risks due to aseptic loosening, infection, stiffness, dislocation/subluxation,  thromboembolic complications and other imponderables were discussed.  The patient acknowledged the explanation, agreed to proceed with the plan and consent was signed. Patient is being admitted for treatment for surgery, pain control, PT, OT, prophylactic antibiotics, VTE prophylaxis, progressive ambulation and ADL's and discharge planning.The patient is planning to be discharged home.     West Pugh Drayke Grabel   PA-C  12/25/2019, 9:52 PM

## 2019-12-26 ENCOUNTER — Ambulatory Visit (HOSPITAL_COMMUNITY): Payer: Medicare Other | Admitting: Physician Assistant

## 2019-12-26 ENCOUNTER — Observation Stay (HOSPITAL_COMMUNITY): Payer: Medicare Other

## 2019-12-26 ENCOUNTER — Ambulatory Visit (HOSPITAL_COMMUNITY): Payer: Medicare Other | Admitting: Certified Registered"

## 2019-12-26 ENCOUNTER — Encounter (HOSPITAL_COMMUNITY): Payer: Self-pay | Admitting: Orthopedic Surgery

## 2019-12-26 ENCOUNTER — Ambulatory Visit (HOSPITAL_COMMUNITY): Payer: Medicare Other

## 2019-12-26 ENCOUNTER — Observation Stay (HOSPITAL_COMMUNITY)
Admission: RE | Admit: 2019-12-26 | Discharge: 2019-12-27 | Disposition: A | Discharge status: Home / Self Care | Payer: Medicare Other | Attending: Orthopedic Surgery | Admitting: Orthopedic Surgery

## 2019-12-26 ENCOUNTER — Encounter (HOSPITAL_COMMUNITY)
Admission: RE | Disposition: A | Discharge status: Home / Self Care | Payer: Self-pay | Source: Home / Self Care | Attending: Orthopedic Surgery

## 2019-12-26 ENCOUNTER — Other Ambulatory Visit: Payer: Self-pay

## 2019-12-26 DIAGNOSIS — E876 Hypokalemia: Secondary | ICD-10-CM | POA: Diagnosis not present

## 2019-12-26 DIAGNOSIS — F17210 Nicotine dependence, cigarettes, uncomplicated: Secondary | ICD-10-CM | POA: Insufficient documentation

## 2019-12-26 DIAGNOSIS — Z79899 Other long term (current) drug therapy: Secondary | ICD-10-CM | POA: Insufficient documentation

## 2019-12-26 DIAGNOSIS — M1612 Unilateral primary osteoarthritis, left hip: Secondary | ICD-10-CM | POA: Diagnosis not present

## 2019-12-26 DIAGNOSIS — Z96649 Presence of unspecified artificial hip joint: Secondary | ICD-10-CM

## 2019-12-26 DIAGNOSIS — I2510 Atherosclerotic heart disease of native coronary artery without angina pectoris: Secondary | ICD-10-CM | POA: Diagnosis not present

## 2019-12-26 DIAGNOSIS — S82122A Displaced fracture of lateral condyle of left tibia, initial encounter for closed fracture: Secondary | ICD-10-CM

## 2019-12-26 DIAGNOSIS — Z7982 Long term (current) use of aspirin: Secondary | ICD-10-CM | POA: Insufficient documentation

## 2019-12-26 DIAGNOSIS — Z96642 Presence of left artificial hip joint: Secondary | ICD-10-CM | POA: Insufficient documentation

## 2019-12-26 DIAGNOSIS — I119 Hypertensive heart disease without heart failure: Secondary | ICD-10-CM | POA: Diagnosis not present

## 2019-12-26 DIAGNOSIS — M25552 Pain in left hip: Secondary | ICD-10-CM | POA: Diagnosis present

## 2019-12-26 HISTORY — PX: TOTAL HIP ARTHROPLASTY: SHX124

## 2019-12-26 LAB — TYPE AND SCREEN
ABO/RH(D): B POS
Antibody Screen: NEGATIVE

## 2019-12-26 LAB — ABO/RH: ABO/RH(D): B POS

## 2019-12-26 SURGERY — ARTHROPLASTY, HIP, TOTAL, ANTERIOR APPROACH
Anesthesia: Spinal | Site: Hip | Laterality: Left

## 2019-12-26 MED ORDER — MIDAZOLAM HCL 2 MG/2ML IJ SOLN
INTRAMUSCULAR | Status: DC | PRN
  Administered 2019-12-26: 2 mg via INTRAVENOUS

## 2019-12-26 MED ORDER — DEXAMETHASONE SODIUM PHOSPHATE 10 MG/ML IJ SOLN
10.0000 mg | Freq: Once | INTRAMUSCULAR | Status: AC
  Administered 2019-12-26: 10 mg via INTRAVENOUS

## 2019-12-26 MED ORDER — BUPIVACAINE IN DEXTROSE 0.75-8.25 % IT SOLN
INTRATHECAL | Status: DC | PRN
  Administered 2019-12-26: 1.6 mL via INTRATHECAL

## 2019-12-26 MED ORDER — PHENYLEPHRINE HCL-NACL 10-0.9 MG/250ML-% IV SOLN
INTRAVENOUS | Status: DC | PRN
Start: 2019-12-26 — End: 2019-12-26
  Administered 2019-12-26: 30 ug/min via INTRAVENOUS

## 2019-12-26 MED ORDER — CELECOXIB 200 MG PO CAPS
200.0000 mg | ORAL_CAPSULE | Freq: Two times a day (BID) | ORAL | Status: DC
  Administered 2019-12-26 – 2019-12-27 (×2): 200 mg via ORAL
  Filled 2019-12-26 (×2): qty 1

## 2019-12-26 MED ORDER — OXYCODONE HCL 5 MG PO TABS
5.0000 mg | ORAL_TABLET | ORAL | Status: DC | PRN
  Administered 2019-12-27: 10 mg via ORAL
  Filled 2019-12-26 (×2): qty 2

## 2019-12-26 MED ORDER — HYDROMORPHONE HCL 1 MG/ML IJ SOLN
0.5000 mg | INTRAMUSCULAR | Status: DC | PRN
  Administered 2019-12-26 (×2): 0.5 mg via INTRAVENOUS
  Filled 2019-12-26 (×2): qty 1

## 2019-12-26 MED ORDER — SODIUM CHLORIDE 0.9 % IR SOLN
Status: DC | PRN
  Administered 2019-12-26: 1000 mL

## 2019-12-26 MED ORDER — ONDANSETRON HCL 4 MG/2ML IJ SOLN
INTRAMUSCULAR | Status: DC | PRN
  Administered 2019-12-26: 4 mg via INTRAVENOUS

## 2019-12-26 MED ORDER — POLYETHYLENE GLYCOL 3350 17 G PO PACK
17.0000 g | PACK | Freq: Two times a day (BID) | ORAL | Status: DC
  Administered 2019-12-26: 17 g via ORAL
  Filled 2019-12-26 (×2): qty 1

## 2019-12-26 MED ORDER — ONDANSETRON HCL 4 MG PO TABS: 4.0000 mg | ORAL_TABLET | Freq: Four times a day (QID) | ORAL | Status: DC | PRN

## 2019-12-26 MED ORDER — METOCLOPRAMIDE HCL 5 MG/ML IJ SOLN: 5.0000 mg | Freq: Three times a day (TID) | INTRAMUSCULAR | Status: DC | PRN

## 2019-12-26 MED ORDER — PROPOFOL 500 MG/50ML IV EMUL
INTRAVENOUS | Status: DC | PRN
  Administered 2019-12-26: 110 ug/kg/min via INTRAVENOUS

## 2019-12-26 MED ORDER — METOCLOPRAMIDE HCL 5 MG PO TABS: 5.0000 mg | ORAL_TABLET | Freq: Three times a day (TID) | ORAL | Status: DC | PRN

## 2019-12-26 MED ORDER — PROPOFOL 10 MG/ML IV BOLUS
INTRAVENOUS | Status: DC | PRN
  Administered 2019-12-26: 20 mg via INTRAVENOUS
  Administered 2019-12-26: 10 mg via INTRAVENOUS

## 2019-12-26 MED ORDER — MAGNESIUM CITRATE PO SOLN: 1.0000 | Freq: Once | ORAL | Status: DC | PRN

## 2019-12-26 MED ORDER — CHLORHEXIDINE GLUCONATE 0.12 % MT SOLN: 15.0000 mL | Freq: Once | OROMUCOSAL | Status: AC

## 2019-12-26 MED ORDER — DOCUSATE SODIUM 100 MG PO CAPS
100.0000 mg | ORAL_CAPSULE | Freq: Two times a day (BID) | ORAL | Status: DC
  Administered 2019-12-26 – 2019-12-27 (×2): 100 mg via ORAL
  Filled 2019-12-26 (×2): qty 1

## 2019-12-26 MED ORDER — PHENYLEPHRINE HCL (PRESSORS) 10 MG/ML IV SOLN
INTRAVENOUS | Status: AC
  Filled 2019-12-26: qty 1

## 2019-12-26 MED ORDER — PHENYLEPHRINE 40 MCG/ML (10ML) SYRINGE FOR IV PUSH (FOR BLOOD PRESSURE SUPPORT)
PREFILLED_SYRINGE | INTRAVENOUS | Status: AC
  Filled 2019-12-26: qty 10

## 2019-12-26 MED ORDER — DEXAMETHASONE SODIUM PHOSPHATE 10 MG/ML IJ SOLN
10.0000 mg | Freq: Once | INTRAMUSCULAR | Status: AC
  Administered 2019-12-27: 10 mg via INTRAVENOUS
  Filled 2019-12-26: qty 1

## 2019-12-26 MED ORDER — ORAL CARE MOUTH RINSE
15.0000 mL | Freq: Once | OROMUCOSAL | Status: AC
  Administered 2019-12-26: 15 mL via OROMUCOSAL

## 2019-12-26 MED ORDER — ASPIRIN 81 MG PO CHEW
81.0000 mg | CHEWABLE_TABLET | Freq: Two times a day (BID) | ORAL | Status: DC
  Administered 2019-12-26 – 2019-12-27 (×2): 81 mg via ORAL
  Filled 2019-12-26 (×2): qty 1

## 2019-12-26 MED ORDER — TRANEXAMIC ACID-NACL 1000-0.7 MG/100ML-% IV SOLN
1000.0000 mg | INTRAVENOUS | Status: AC
  Administered 2019-12-26: 1000 mg via INTRAVENOUS
  Filled 2019-12-26: qty 100

## 2019-12-26 MED ORDER — MENTHOL 3 MG MT LOZG: 1.0000 | LOZENGE | OROMUCOSAL | Status: DC | PRN

## 2019-12-26 MED ORDER — ONDANSETRON HCL 4 MG/2ML IJ SOLN
INTRAMUSCULAR | Status: AC
  Filled 2019-12-26: qty 2

## 2019-12-26 MED ORDER — ACETAMINOPHEN 500 MG PO TABS
1000.0000 mg | ORAL_TABLET | Freq: Four times a day (QID) | ORAL | Status: DC
  Administered 2019-12-26 – 2019-12-27 (×2): 1000 mg via ORAL
  Filled 2019-12-26 (×2): qty 2

## 2019-12-26 MED ORDER — LACTATED RINGERS IV SOLN: INTRAVENOUS | Status: DC

## 2019-12-26 MED ORDER — METHOCARBAMOL 500 MG PO TABS: 500.0000 mg | ORAL_TABLET | Freq: Four times a day (QID) | ORAL | Status: DC | PRN

## 2019-12-26 MED ORDER — ONDANSETRON HCL 4 MG/2ML IJ SOLN: 4.0000 mg | Freq: Four times a day (QID) | INTRAMUSCULAR | Status: DC | PRN

## 2019-12-26 MED ORDER — CEFAZOLIN SODIUM-DEXTROSE 2-4 GM/100ML-% IV SOLN
2.0000 g | Freq: Four times a day (QID) | INTRAVENOUS | Status: AC
  Administered 2019-12-26 – 2019-12-27 (×2): 2 g via INTRAVENOUS
  Filled 2019-12-26 (×2): qty 100

## 2019-12-26 MED ORDER — CEFAZOLIN SODIUM-DEXTROSE 2-4 GM/100ML-% IV SOLN
2.0000 g | INTRAVENOUS | Status: AC
  Administered 2019-12-26: 2 g via INTRAVENOUS
  Filled 2019-12-26: qty 100

## 2019-12-26 MED ORDER — DIPHENHYDRAMINE HCL 12.5 MG/5ML PO ELIX: 12.5000 mg | ORAL_SOLUTION | ORAL | Status: DC | PRN

## 2019-12-26 MED ORDER — PROPOFOL 10 MG/ML IV BOLUS
INTRAVENOUS | Status: AC
  Filled 2019-12-26: qty 20

## 2019-12-26 MED ORDER — METHOCARBAMOL 500 MG IVPB - SIMPLE MED
500.0000 mg | Freq: Four times a day (QID) | INTRAVENOUS | Status: DC | PRN
  Filled 2019-12-26: qty 50

## 2019-12-26 MED ORDER — FERROUS SULFATE 325 (65 FE) MG PO TABS
325.0000 mg | ORAL_TABLET | Freq: Three times a day (TID) | ORAL | Status: DC
  Administered 2019-12-27 (×2): 325 mg via ORAL
  Filled 2019-12-26 (×2): qty 1

## 2019-12-26 MED ORDER — OXYCODONE HCL 5 MG PO TABS: 10.0000 mg | ORAL_TABLET | ORAL | Status: DC | PRN

## 2019-12-26 MED ORDER — PHENYLEPHRINE 40 MCG/ML (10ML) SYRINGE FOR IV PUSH (FOR BLOOD PRESSURE SUPPORT)
PREFILLED_SYRINGE | INTRAVENOUS | Status: DC | PRN
  Administered 2019-12-26 (×2): 120 ug via INTRAVENOUS

## 2019-12-26 MED ORDER — MIDAZOLAM HCL 2 MG/2ML IJ SOLN
INTRAMUSCULAR | Status: AC
  Filled 2019-12-26: qty 2

## 2019-12-26 MED ORDER — PHENOL 1.4 % MT LIQD: 1.0000 | OROMUCOSAL | Status: DC | PRN

## 2019-12-26 MED ORDER — SODIUM CHLORIDE 0.9 % IV SOLN: INTRAVENOUS | Status: DC

## 2019-12-26 MED ORDER — BISACODYL 10 MG RE SUPP: 10.0000 mg | Freq: Every day | RECTAL | Status: DC | PRN

## 2019-12-26 MED ORDER — ALUM & MAG HYDROXIDE-SIMETH 200-200-20 MG/5ML PO SUSP: 15.0000 mL | ORAL | Status: DC | PRN

## 2019-12-26 MED ORDER — DEXAMETHASONE SODIUM PHOSPHATE 10 MG/ML IJ SOLN
INTRAMUSCULAR | Status: AC
  Filled 2019-12-26: qty 1

## 2019-12-26 MED ORDER — PROPOFOL 1000 MG/100ML IV EMUL
INTRAVENOUS | Status: AC
  Filled 2019-12-26: qty 100

## 2019-12-26 MED ORDER — TRANEXAMIC ACID-NACL 1000-0.7 MG/100ML-% IV SOLN
1000.0000 mg | Freq: Once | INTRAVENOUS | Status: AC
  Administered 2019-12-26: 1000 mg via INTRAVENOUS
  Filled 2019-12-26: qty 100

## 2019-12-26 SURGICAL SUPPLY — 48 items
ADH SKN CLS APL DERMABOND .7 (GAUZE/BANDAGES/DRESSINGS) ×1
BAG DECANTER FOR FLEXI CONT (MISCELLANEOUS) IMPLANT
BAG SPEC THK2 15X12 ZIP CLS (MISCELLANEOUS)
BAG ZIPLOCK 12X15 (MISCELLANEOUS) IMPLANT
BLADE SAG 18X100X1.27 (BLADE) ×3 IMPLANT
BLADE SURG SZ10 CARB STEEL (BLADE) ×6 IMPLANT
COVER PERINEAL POST (MISCELLANEOUS) ×3 IMPLANT
COVER SURGICAL LIGHT HANDLE (MISCELLANEOUS) ×3 IMPLANT
COVER WAND RF STERILE (DRAPES) IMPLANT
CUP ACETBLR 52 OD PINNACLE (Hips) ×2 IMPLANT
DERMABOND ADVANCED (GAUZE/BANDAGES/DRESSINGS) ×2
DERMABOND ADVANCED .7 DNX12 (GAUZE/BANDAGES/DRESSINGS) ×1 IMPLANT
DRAPE STERI IOBAN 125X83 (DRAPES) ×3 IMPLANT
DRAPE U-SHAPE 47X51 STRL (DRAPES) ×6 IMPLANT
DRESSING AQUACEL AG SP 3.5X10 (GAUZE/BANDAGES/DRESSINGS) ×1 IMPLANT
DRSG AQUACEL AG ADV 3.5X10 (GAUZE/BANDAGES/DRESSINGS) ×2 IMPLANT
DRSG AQUACEL AG SP 3.5X10 (GAUZE/BANDAGES/DRESSINGS) ×3
DURAPREP 26ML APPLICATOR (WOUND CARE) ×3 IMPLANT
ELECT REM PT RETURN 15FT ADLT (MISCELLANEOUS) ×3 IMPLANT
ELIMINATOR HOLE APEX DEPUY (Hips) ×2 IMPLANT
GLOVE BIO SURGEON STRL SZ 6 (GLOVE) ×6 IMPLANT
GLOVE BIOGEL PI IND STRL 6.5 (GLOVE) ×1 IMPLANT
GLOVE BIOGEL PI IND STRL 7.5 (GLOVE) ×1 IMPLANT
GLOVE BIOGEL PI IND STRL 8.5 (GLOVE) ×1 IMPLANT
GLOVE BIOGEL PI INDICATOR 6.5 (GLOVE) ×2
GLOVE BIOGEL PI INDICATOR 7.5 (GLOVE) ×2
GLOVE BIOGEL PI INDICATOR 8.5 (GLOVE) ×2
GLOVE ECLIPSE 8.0 STRL XLNG CF (GLOVE) ×6 IMPLANT
GLOVE ORTHO TXT STRL SZ7.5 (GLOVE) ×6 IMPLANT
GOWN STRL REUS W/TWL LRG LVL3 (GOWN DISPOSABLE) ×6 IMPLANT
GOWN STRL REUS W/TWL XL LVL3 (GOWN DISPOSABLE) ×3 IMPLANT
HEAD CERAMIC 36 PLUS 8.5 12 14 (Hips) ×2 IMPLANT
HOLDER FOLEY CATH W/STRAP (MISCELLANEOUS) ×3 IMPLANT
KIT TURNOVER KIT A (KITS) IMPLANT
LINER NEUTRAL 52X36MM PLUS 4 (Liner) ×2 IMPLANT
PACK ANTERIOR HIP CUSTOM (KITS) ×3 IMPLANT
PENCIL SMOKE EVACUATOR (MISCELLANEOUS) IMPLANT
SCREW 6.5MMX30MM (Screw) ×2 IMPLANT
STEM FEMORAL SZ5 HIGH ACTIS (Stem) ×2 IMPLANT
SUT MNCRL AB 4-0 PS2 18 (SUTURE) ×3 IMPLANT
SUT STRATAFIX 0 PDS 27 VIOLET (SUTURE) ×3
SUT VIC AB 1 CT1 36 (SUTURE) ×9 IMPLANT
SUT VIC AB 2-0 CT1 27 (SUTURE) ×6
SUT VIC AB 2-0 CT1 TAPERPNT 27 (SUTURE) ×2 IMPLANT
SUTURE STRATFX 0 PDS 27 VIOLET (SUTURE) ×1 IMPLANT
TRAY FOLEY MTR SLVR 16FR STAT (SET/KITS/TRAYS/PACK) IMPLANT
WATER STERILE IRR 1000ML POUR (IV SOLUTION) ×3 IMPLANT
YANKAUER SUCT BULB TIP 10FT TU (MISCELLANEOUS) IMPLANT

## 2019-12-26 NOTE — Anesthesia Procedure Notes (Signed)
Procedure Name: MAC Date/Time: 12/26/2019 1:32 PM Performed by: Niel Hummer, CRNA Pre-anesthesia Checklist: Patient identified, Emergency Drugs available, Suction available and Patient being monitored Oxygen Delivery Method: Simple face mask

## 2019-12-26 NOTE — Anesthesia Procedure Notes (Signed)
Spinal  Patient location during procedure: OR Start time: 12/26/2019 1:32 PM End time: 12/26/2019 1:41 PM Staffing Performed: anesthesiologist  Anesthesiologist: Josephine Igo, MD Preanesthetic Checklist Completed: patient identified, IV checked, site marked, risks and benefits discussed, surgical consent, monitors and equipment checked, pre-op evaluation and timeout performed Spinal Block Patient position: sitting Prep: DuraPrep and site prepped and draped Patient monitoring: heart rate, cardiac monitor, continuous pulse ox and blood pressure Approach: midline Location: L4-5 Injection technique: single-shot Needle Needle type: Pencan  Needle gauge: 24 G Needle length: 9 cm Assessment Sensory level: T4 Additional Notes Patient tolerated procedure well. Adequate sensory level.

## 2019-12-26 NOTE — Anesthesia Postprocedure Evaluation (Signed)
Anesthesia Post Note  Patient: Erika Russell  Procedure(s) Performed: TOTAL HIP ARTHROPLASTY ANTERIOR APPROACH (Left Hip)     Patient location during evaluation: PACU Anesthesia Type: Spinal Level of consciousness: oriented and awake and alert Pain management: pain level controlled Vital Signs Assessment: post-procedure vital signs reviewed and stable Respiratory status: spontaneous breathing, respiratory function stable and nonlabored ventilation Cardiovascular status: blood pressure returned to baseline and stable Postop Assessment: no headache, no backache, no apparent nausea or vomiting, spinal receding and patient able to bend at knees Anesthetic complications: no   No complications documented.  Last Vitals:  Vitals:   12/26/19 1600 12/26/19 1615  BP: (!) 118/51 (!) 121/53  Pulse: 65 50  Resp: 17 14  Temp:    SpO2: 100% 100%    Last Pain:  Vitals:   12/26/19 1615  TempSrc:   PainSc: 0-No pain    LLE Motor Response: No movement due to regional block (12/26/19 1615) LLE Sensation: Numbness (12/26/19 1615) RLE Motor Response: No movement due to regional block (12/26/19 1615) RLE Sensation: Numbness (12/26/19 1615) L Sensory Level: L2-Upper inner thigh, upper buttock (12/26/19 1615) R Sensory Level: L2-Upper inner thigh, upper buttock (12/26/19 1615)  [name]

## 2019-12-26 NOTE — Plan of Care (Signed)

## 2019-12-26 NOTE — Op Note (Signed)
NAME:  Erika Russell                ACCOUNT NO.: [id number]      MEDICAL RECORD NO.: [id number]      FACILITY:  [facility]      PHYSICIAN:  [name]  DATE OF BIRTH:  Jan 30, 1946     DATE OF PROCEDURE:  12/26/2019                                 OPERATIVE REPORT         PREOPERATIVE DIAGNOSIS: Left  hip osteoarthritis.      POSTOPERATIVE DIAGNOSIS:  Left hip osteoarthritis.      PROCEDURE:  Left total hip replacement through an anterior approach   utilizing DePuy THR system, component size 52 mm pinnacle cup, a size 36+4 neutral   Altrex liner, a size 5 Hi Actis stem with a 36+8.5 delta ceramic   ball.      SURGEON:  Pietro Cassis. Alvan Dame, M.D.      ASSISTANT:  Danae Orleans, PA-C     ANESTHESIA:  Spinal.      SPECIMENS:  None.      COMPLICATIONS:  None.      BLOOD LOSS:  200 cc     DRAINS:  None.      INDICATION OF THE PROCEDURE:  Erika Russell is a 74 y.o. female who had   presented to office for evaluation of left hip pain.  Radiographs revealed   progressive degenerative changes with bone-on-bone   articulation of the  hip joint, including subchondral cystic changes and osteophytes.  The patient had painful limited range of   motion significantly affecting their overall quality of life and function.  The patient was failing to    respond to conservative measures including medications and/or injections and activity modification and at this point was ready   to proceed with more definitive measures.  Consent was obtained for   benefit of pain relief.  Specific risks of infection, DVT, component   failure, dislocation, neurovascular injury, and need for revision surgery were reviewed in the office as well discussion of   the anterior versus posterior approach were reviewed.     PROCEDURE IN DETAIL:  The patient was brought to operative theater.   Once adequate anesthesia, preoperative antibiotics, 2 gm of Ancef, 1 gm of Tranexamic Acid, and  10 mg of Decadron were administered, the patient was positioned supine on the [employer] table.  Once the patient was safely positioned with adequate padding of boney prominences we predraped out the hip, and used fluoroscopy to confirm orientation of the pelvis.      The left hip was then prepped and draped from proximal iliac crest to   mid thigh with a shower curtain technique.      Time-out was performed identifying the patient, planned procedure, and the appropriate extremity.     An incision was then made 2 cm lateral to the   anterior superior iliac spine extending over the orientation of the   tensor fascia lata muscle and sharp dissection was carried down to the   fascia of the muscle.      The fascia was then incised.  The muscle belly was identified and swept   laterally and retractor placed along the superior neck.  Following   cauterization of the circumflex vessels and removing some  pericapsular   fat, a second cobra retractor was placed on the inferior neck.  A T-capsulotomy was made along the line of the   superior neck to the trochanteric fossa, then extended proximally and   distally.  Tag sutures were placed and the retractors were then placed   intracapsular.  We then identified the trochanteric fossa and   orientation of my neck cut and then made a neck osteotomy with the femur on traction.  The femoral   head was removed without difficulty or complication.  Traction was let   off and retractors were placed posterior and anterior around the   acetabulum.      The labrum and foveal tissue were debrided.  I began reaming with a 44 mm   reamer and reamed up to 51 mm reamer with good bony bed preparation and a 52 mm  cup was chosen.  The final 52 mm Pinnacle cup was then impacted under fluoroscopy to confirm the depth of penetration and orientation with respect to   Abduction and forward flexion.  A screw was placed into the ilium followed by the hole eliminator.  The final    36+4 neutral Altrex liner was impacted with good visualized rim fit.  The cup was positioned anatomically within the acetabular portion of the pelvis.      At this point, the femur was rolled to 100 degrees.  Further capsule was   released off the inferior aspect of the femoral neck.  I then   released the superior capsule proximally.  With the leg in a neutral position the hook was placed laterally   along the femur under the vastus lateralis origin and elevated manually and then held in position using the hook attachment on the bed.  The leg was then extended and adducted with the leg rolled to 100   degrees of external rotation.  Retractors were placed along the medial calcar and posteriorly over the greater trochanter.  Once the proximal femur was fully   exposed, I used a box osteotome to set orientation.  I then began   broaching with the starting chili pepper broach and passed this by hand and then broached up to 5.  With the 5 broach in place I chose a high offset neck and did several trial reductions.  The offset was appropriate, leg lengths   appeared to be equal best matched with the +8.5 head ball trial confirmed radiographically.   Given these findings, I went ahead and dislocated the hip, repositioned all   retractors and positioned the right hip in the extended and abducted position.  The final 5 Hi  Actis stem was   chosen and it was impacted down to the level of neck cut.  Based on this   and the trial reductions, a final 36+8.5 delta ceramic ball was chosen and   impacted onto a clean and dry trunnion, and the hip was reduced.  The   hip had been irrigated throughout the case again at this point.  I did   reapproximate the superior capsular leaflet to the anterior leaflet   using #1 Vicryl.  The fascia of the   tensor fascia lata muscle was then reapproximated using #1 Vicryl and #0 Stratafix sutures.  The   remaining wound was closed with 2-0 Vicryl and running 4-0 Monocryl.    The hip was cleaned, dried, and dressed sterilely using Dermabond and   Aquacel dressing.  The patient was then brought  to recovery room in stable condition tolerating the procedure well.    Danae Orleans, PA-C was present for the entirety of the case involved from   preoperative positioning, perioperative retractor management, general   facilitation of the case, as well as primary wound closure as assistant.            Pietro Cassis Alvan Dame, M.D.        12/26/2019 1:53 PM

## 2019-12-26 NOTE — Transfer of Care (Signed)
Immediate Anesthesia Transfer of Care Note  Patient: Erika Russell  Procedure(s) Performed: TOTAL HIP ARTHROPLASTY ANTERIOR APPROACH (Left Hip)  Patient Location: PACU  Anesthesia Type:Spinal  Level of Consciousness: awake, alert  and oriented  Airway & Oxygen Therapy: Patient Spontanous Breathing and Patient connected to face mask oxygen  Post-op Assessment: Report given to RN and Post -op Vital signs reviewed and stable  Post vital signs: Reviewed and stable  Last Vitals:  Vitals Value Taken Time  BP 124/43 12/26/19 1521  Temp    Pulse    Resp 17 12/26/19 1523  SpO2    Vitals shown include unvalidated device data.  Last Pain:  Vitals:   12/26/19 1155  TempSrc: Oral         Complications: No complications documented.

## 2019-12-26 NOTE — Interval H&P Note (Signed)
History and Physical Interval Note:  12/26/2019 12:38 PM  Erika Russell  has presented today for surgery, with the diagnosis of Left hip osteoarthritis.  The various methods of treatment have been discussed with the patient and family. After consideration of risks, benefits and other options for treatment, the patient has consented to  Procedure(s) with comments: TOTAL HIP ARTHROPLASTY ANTERIOR APPROACH (Left) - 70 mins as a surgical intervention.  The patient's history has been reviewed, patient examined, no change in status, stable for surgery.  I have reviewed the patient's chart and labs.  Questions were answered to the patient's satisfaction.     [name]

## 2019-12-26 NOTE — Discharge Instructions (Signed)

## 2019-12-27 DIAGNOSIS — M1612 Unilateral primary osteoarthritis, left hip: Secondary | ICD-10-CM | POA: Diagnosis not present

## 2019-12-27 DIAGNOSIS — E876 Hypokalemia: Secondary | ICD-10-CM | POA: Diagnosis not present

## 2019-12-27 LAB — CBC
HCT: 29.4 % — ABNORMAL LOW (ref 36.0–46.0)
Hemoglobin: 9.3 g/dL — ABNORMAL LOW (ref 12.0–15.0)
MCH: 21.7 pg — ABNORMAL LOW (ref 26.0–34.0)
MCHC: 31.6 g/dL (ref 30.0–36.0)
MCV: 68.5 fL — ABNORMAL LOW (ref 80.0–100.0)
Platelets: 172 10*3/uL (ref 150–400)
RBC: 4.29 MIL/uL (ref 3.87–5.11)
RDW: 15.5 % (ref 11.5–15.5)
WBC: 11.9 10*3/uL — ABNORMAL HIGH (ref 4.0–10.5)
nRBC: 0 % (ref 0.0–0.2)

## 2019-12-27 LAB — BASIC METABOLIC PANEL
Anion gap: 11 (ref 5–15)
BUN: 12 mg/dL (ref 8–23)
CO2: 21 mmol/L — ABNORMAL LOW (ref 22–32)
Calcium: 7.6 mg/dL — ABNORMAL LOW (ref 8.9–10.3)
Chloride: 104 mmol/L (ref 98–111)
Creatinine, Ser: 0.65 mg/dL (ref 0.44–1.00)
GFR calc Af Amer: >60 mL/min (ref >60)
GFR calc non Af Amer: >60 mL/min (ref >60)
Glucose, Bld: 197 mg/dL — ABNORMAL HIGH (ref 70–99)
Potassium: 3.3 mmol/L — ABNORMAL LOW (ref 3.5–5.1)
Sodium: 136 mmol/L (ref 135–145)

## 2019-12-27 MED ORDER — ASPIRIN 81 MG PO CHEW
81.0000 mg | CHEWABLE_TABLET | Freq: Two times a day (BID) | ORAL | 0 refills | Status: AC
Start: 2019-12-28 — End: 2020-01-27

## 2019-12-27 MED ORDER — POTASSIUM CHLORIDE CRYS ER 20 MEQ PO TBCR
40.0000 meq | EXTENDED_RELEASE_TABLET | Freq: Once | ORAL | Status: AC
  Administered 2019-12-27: 40 meq via ORAL
  Filled 2019-12-27: qty 2

## 2019-12-27 MED ORDER — DOCUSATE SODIUM 100 MG PO CAPS
100.0000 mg | ORAL_CAPSULE | Freq: Two times a day (BID) | ORAL | 0 refills | Status: AC
Start: 2019-12-27 — End: ?

## 2019-12-27 MED ORDER — POLYETHYLENE GLYCOL 3350 17 G PO PACK
17.0000 g | PACK | Freq: Two times a day (BID) | ORAL | 0 refills | Status: AC
Start: 2019-12-27 — End: ?

## 2019-12-27 MED ORDER — HYDROCODONE-ACETAMINOPHEN 7.5-325 MG PO TABS: 1.0000 | ORAL_TABLET | ORAL | 0 refills | Status: DC | PRN

## 2019-12-27 MED ORDER — HYDROCODONE-ACETAMINOPHEN 5-325 MG PO TABS
1.0000 | ORAL_TABLET | ORAL | Status: DC | PRN
  Administered 2019-12-27 (×2): 2 via ORAL
  Filled 2019-12-27 (×2): qty 2

## 2019-12-27 MED ORDER — METHOCARBAMOL 500 MG PO TABS: 500.0000 mg | ORAL_TABLET | Freq: Four times a day (QID) | ORAL | 0 refills | Status: DC | PRN

## 2019-12-27 MED ORDER — FERROUS SULFATE 325 (65 FE) MG PO TABS: 325.0000 mg | ORAL_TABLET | Freq: Three times a day (TID) | ORAL | 0 refills | Status: AC

## 2019-12-27 NOTE — Evaluation (Signed)
Physical Therapy Evaluation Patient Details Name: Erika Russell MRN: [id number] DOB: 28-Nov-1945 Today's Date: 12/27/2019   History of Present Illness  74 yo female s/p L THA-direct anterior 12/26/19.  Clinical Impression  On eval, pt required Min assist for mobility. She walked ~125 fee with use of a RW. Minimal pain with activity. Will plan to have a 2nd session prior to d/c home later today. Pt is requesting HHPT f/u since she lives alone-made RN aware.     Follow Up Recommendations Home health PT (if surgeon is agreeable)    Equipment Recommendations  Rolling walker with 5" wheels    Recommendations for Other Services       Precautions / Restrictions Precautions Precautions: Fall Restrictions Weight Bearing Restrictions: No Other Position/Activity Restrictions: WBAT      Mobility  Bed Mobility Overal bed mobility: Needs Assistance Bed Mobility: Supine to Sit     Supine to sit: Min guard;HOB elevated     General bed mobility comments: Min guard for safety. Increased time.  Transfers Overall transfer level: Needs assistance   Transfers: Sit to/from Stand Sit to Stand: Min assist         General transfer comment: VCs safety, technique, hand placement. Poor control on descent.  Ambulation/Gait Ambulation/Gait assistance: Min guard Gait Distance (Feet): 125 Feet Assistive device: Rolling walker (2 wheeled) Gait Pattern/deviations: Step-to pattern;Step-through pattern;Decreased stride length     General Gait Details: Min guard for safety. VCs safety, sequence. Slow gait speed.  Stairs            Wheelchair Mobility    Modified Rankin (Stroke Patients Only)       Balance Overall balance assessment: Needs assistance         Standing balance support: Bilateral upper extremity supported Standing balance-Leahy Scale: Fair                               Pertinent Vitals/Pain Pain Assessment: 0-10 Pain Score: 5  Pain Location:  R hip/thigh Pain Descriptors / Indicators: Discomfort;Sore Pain Intervention(s): Monitored during session;Ice applied;Repositioned    Home Living Family/patient expects to be discharged to:: Private residence Living Arrangements: Alone   Type of Home: House Home Access: Stairs to enter Entrance Stairs-Rails: Left Entrance Stairs-Number of Steps: 2-back Home Layout: One level Home Equipment: None      Prior Function Level of Independence: Independent               Hand Dominance        Extremity/Trunk Assessment   Upper Extremity Assessment Upper Extremity Assessment: Overall WFL for tasks assessed    Lower Extremity Assessment Lower Extremity Assessment: Generalized weakness    Cervical / Trunk Assessment Cervical / Trunk Assessment: Normal  Communication   Communication: No difficulties  Cognition Arousal/Alertness: Awake/alert Behavior During Therapy: WFL for tasks assessed/performed Overall Cognitive Status: Within Functional Limits for tasks assessed                                        General Comments      Exercises Total Joint Exercises Ankle Circles/Pumps: AROM;Both;10 reps Quad Sets: AROM;Both;10 reps Heel Slides: AROM;AAROM;Left;10 reps Hip ABduction/ADduction: AROM;AAROM;Left;10 reps   Assessment/Plan    PT Assessment Patient needs continued PT services  PT Problem List Decreased strength;Decreased range of motion;Decreased mobility;Decreased activity tolerance;Decreased balance;Decreased knowledge of use  of DME;Pain       PT Treatment Interventions DME instruction;Gait training;Therapeutic activities;Therapeutic exercise;Patient/family education;Balance training;Stair training;Functional mobility training    PT Goals (Current goals can be found in the Care Plan section)  Acute Rehab PT Goals Patient Stated Goal: regain independence PT Goal Formulation: With patient Time For Goal Achievement: 01/10/20 Potential to  Achieve Goals: Good    Frequency 7X/week   Barriers to discharge        Co-evaluation               AM-PAC PT "6 Clicks" Mobility  Outcome Measure Help needed turning from your back to your side while in a flat bed without using bedrails?: A Little Help needed moving from lying on your back to sitting on the side of a flat bed without using bedrails?: A Little Help needed moving to and from a bed to a chair (including a wheelchair)?: A Little Help needed standing up from a chair using your arms (e.g., wheelchair or bedside chair)?: A Little Help needed to walk in hospital room?: A Little Help needed climbing 3-5 steps with a railing? : A Little 6 Click Score: 18    End of Session Equipment Utilized During Treatment: Gait belt Activity Tolerance: Patient tolerated treatment well Patient left: in chair;with call bell/phone within reach;with chair alarm set   PT Visit Diagnosis: Pain;Other abnormalities of gait and mobility (R26.89) Pain - Right/Left: Left Pain - part of body: Hip    Time: 3414-4360 PT Time Calculation (min) (ACUTE ONLY): 25 min   Charges:   PT Evaluation $PT Eval Low Complexity: 1 Low PT Treatments $Gait Training: 8-22 mins          Doreatha Massed, PT Acute Rehabilitation  Office: [phone] Pager: [phone]

## 2019-12-27 NOTE — Discharge Summary (Signed)
Patient ID: Erika Russell MRN: [id number] DOB/AGE: [birth year] 74 y.o.  Admit date: 12/26/2019 Discharge date: 12/27/2019  Admission Diagnoses:  Principal Problem:   Osteoarthritis of left hip Active Problems:   S/P total hip arthroplasty   Hypokalemia   Discharge Diagnoses:  Same  Past Medical History:  Diagnosis Date  . Arthritis    lower back ,hips  . Cervical dysplasia 1988  . Colitis    collagenous  . Coronary artery disease    blockage of RCA '09  . Elevated cholesterol   . Heart block   . History of kidney stones 2020  . Hypertension   . IBS (irritable bowel syndrome)   . Kidney stone   . Myocardial infarction ([place])   . Osteopenia 11/2018   T score -1.8 FRAX 4.7% / 1.1%.  Overall stable from prior DEXA    Surgeries: Procedure(s):  LEFT TOTAL HIP ARTHROPLASTY ANTERIOR APPROACH on 12/26/2019   Consultants: N/A  Discharged Condition: Improved  Hospital Course: [name] is an 74 y.o. female who was admitted 12/26/2019 for operative treatment ofOsteoarthritis of left hip. Patient has severe unremitting pain that affects sleep, daily activities, and work/hobbies. After pre-op clearance the patient was taken to the operating room on 12/26/2019 and underwent  Procedure(s):  LEFT TOTAL HIP ARTHROPLASTY ANTERIOR APPROACH.    Patient was given perioperative antibiotics:  Anti-infectives (From admission, onward)   Start     Dose/Rate Route Frequency Ordered Stop   12/26/19 1930  ceFAZolin (ANCEF) IVPB 2g/100 mL premix        2 g 200 mL/hr over 30 Minutes Intravenous Every 6 hours 12/26/19 1928 12/27/19 0214   12/26/19 1145  ceFAZolin (ANCEF) IVPB 2g/100 mL premix        2 g 200 mL/hr over 30 Minutes Intravenous On call to O.R. 12/26/19 1142 12/26/19 1411       Patient was given sequential compression devices, early ambulation, and chemoprophylaxis to prevent DVT.  Patient benefited maximally from hospital stay and there were no complications.     Recent vital signs:  Patient Vitals for the past 24 hrs:  BP Temp Temp src Pulse Resp SpO2 Height Weight  12/27/19 0600 (!) 134/54 98.5 F (36.9 C) Oral 67 16 98 % -- --  12/27/19 0132 (!) 120/47 98.6 F (37 C) Oral 86 17 100 % -- --  12/26/19 2337 -- -- -- -- -- -- 5\' 3"  (1.6 m) 59.5 kg  12/26/19 2141 (!) 144/54 98.4 F (36.9 C) Oral 87 18 100 % -- --  12/26/19 2039 (!) 145/67 98.3 F (36.8 C) Oral 85 16 100 % -- --  12/26/19 1903 (!) 133/50 -- -- 78 16 100 % -- --  12/26/19 1830 (!) 120/55 98 F (36.7 C) Oral 66 15 100 % -- --  12/26/19 1730 (!) 133/56 97.6 F (36.4 C) -- 65 15 100 % -- --  12/26/19 1715 (!) 122/55 -- -- 56 13 100 % -- --  12/26/19 1700 (!) 124/51 -- -- 60 15 100 % -- --  12/26/19 1645 (!) 127/53 -- -- 53 13 100 % -- --  12/26/19 1630 (!) 129/49 -- -- 52 14 100 % -- --  12/26/19 1615 (!) 121/53 -- -- 50 14 100 % -- --  12/26/19 1600 (!) 118/51 -- -- 65 17 100 % -- --  12/26/19 1545 (!) 113/52 -- -- 58 15 99 % -- --  12/26/19 1530 (!) 108/39 -- -- 75 17 99 % -- --  12/26/19 1521 (!) 124/43 (!) 97.5 F (36.4 C) -- 75 21 100 % -- --  12/26/19 1155 (!) 158/67 98.5 F (36.9 C) Oral 61 16 100 % -- --     Recent laboratory studies:  Recent Labs    12/27/19 0257  WBC 11.9*  HGB 9.3*  HCT 29.4*  PLT 172  NA 136  K 3.3*  CL 104  CO2 21*  BUN 12  CREATININE 0.65  GLUCOSE 197*  CALCIUM 7.6*     Discharge Medications:   Allergies as of 12/27/2019      Reactions   Codeine Itching   Pyridium [phenazopyridine] Nausea And Vomiting      Medication List    STOP taking these medications   aspirin EC 81 MG tablet Replaced by: aspirin 81 MG chewable tablet   oxyCODONE-acetaminophen 5-325 MG tablet Commonly known as: PERCOCET/ROXICET   tamsulosin 0.4 MG Caps capsule Commonly known as: FLOMAX     TAKE these medications   amLODipine 5 MG tablet Commonly known as: NORVASC Take 5 mg by mouth daily.   ascorbic acid 500 MG tablet Commonly known as:  VITAMIN C Take 5,000 mg by mouth daily.   aspirin 81 MG chewable tablet Commonly known as: Aspirin Childrens Chew 1 tablet (81 mg total) by mouth 2 (two) times daily. Take for 4 weeks, then resume regular dose. Start taking on: December 28, 2019 Replaces: aspirin EC 81 MG tablet   cetirizine 10 MG tablet Commonly known as: ZYRTEC Take 10 mg by mouth daily as needed for allergies.   cholecalciferol 25 MCG (1000 UNIT) tablet Commonly known as: VITAMIN D Take 1,000 Units by mouth daily.   cholestyramine 4 g packet Commonly known as: QUESTRAN Take 4 g by mouth 2 (two) times daily as needed (colitis).   docusate sodium 100 MG capsule Commonly known as: Colace Take 1 capsule (100 mg total) by mouth 2 (two) times daily.   ferrous sulfate 325 (65 FE) MG tablet Commonly known as: FerrouSul Take 1 tablet (325 mg total) by mouth 3 (three) times daily with meals for 14 days.   Fish Oil 1000 MG Caps Take 1,000 mg by mouth daily.   glycopyrrolate 2 MG tablet Commonly known as: ROBINUL Take 2 mg by mouth 3 (three) times daily as needed (for colitis).   HYDROcodone-acetaminophen 7.5-325 MG tablet Commonly known as: Norco Take 1-2 tablets by mouth every 4 (four) hours as needed for moderate pain.   lidocaine 5 % Commonly known as: LIDODERM Place 1 patch onto the skin daily as needed (back pain.).   Lipitor 40 MG tablet Generic drug: atorvastatin Take 20 mg by mouth daily.   MAGnesium-Oxide 400 (241.3 Mg) MG tablet Generic drug: magnesium oxide Take 800 mg by mouth daily.   methocarbamol 500 MG tablet Commonly known as: Robaxin Take 1 tablet (500 mg total) by mouth every 6 (six) hours as needed for muscle spasms.   metoprolol tartrate 50 MG tablet Commonly known as: LOPRESSOR Take 50 mg by mouth 2 (two) times daily.   multivitamin with minerals Tabs tablet Take 1 tablet by mouth daily.   nystatin-triamcinolone ointment Commonly known as: MYCOLOG Apply 1 application  topically 2 (two) times daily. What changed:   when to take this  reasons to take this   olmesartan 40 MG tablet Commonly known as: BENICAR Take 40 mg by mouth daily.   omeprazole 40 MG capsule Commonly known as: PRILOSEC Take 40 mg by mouth daily as needed (heartburn/indigestion.).  polyethylene glycol 17 g packet Commonly known as: MIRALAX / GLYCOLAX Take 17 g by mouth 2 (two) times daily.   potassium chloride SA 20 MEQ tablet Commonly known as: KLOR-CON Take 2 tablets (40 mEq total) by mouth daily. What changed:   how much to take  when to take this   PROBIOTIC PO Take 1 capsule by mouth daily.   Restasis 0.05 % ophthalmic emulsion Generic drug: cycloSPORINE Place 1 drop into both eyes every 12 (twelve) hours.   sodium chloride 0.65 % Soln nasal spray Commonly known as: OCEAN Place 2 sprays into both nostrils 4 (four) times daily as needed for congestion.            Discharge Care Instructions  (From admission, onward)         Start     Ordered   12/27/19 0000  Change dressing       Comments: Maintain surgical dressing until follow up in the clinic. If the edges start to pull up, may reinforce with tape. If the dressing is no longer working, may remove and cover with gauze and tape, but must keep the area dry and clean.  Call with any questions or concerns.   12/27/19 0809          Diagnostic Studies: DG Pelvis Portable  Result Date: 12/26/2019 CLINICAL DATA:  Left hip replacement. EXAM: PORTABLE PELVIS 1-2 VIEWS COMPARISON:  None. FINDINGS: Left hip arthroplasty in expected alignment. No periprosthetic lucency or fracture. Recent postsurgical change includes air and edema in the joint space and soft tissues. Advanced right hip osteoarthritis is also noted. IMPRESSION: Left hip arthroplasty without immediate postoperative complication. Electronically Signed   By: Keith Rake M.D.   On: 12/26/2019 16:35   DG C-Arm 1-60 Min-No Report  Result  Date: 12/26/2019 Fluoroscopy was utilized by the requesting physician.  No radiographic interpretation.   DG HIP OPERATIVE UNILAT W OR W/O PELVIS LEFT  Result Date: 12/26/2019 CLINICAL DATA:  Left hip replacement. EXAM: OPERATIVE LEFT HIP (WITH PELVIS IF PERFORMED) TECHNIQUE: Fluoroscopic spot image(s) were submitted for interpretation post-operatively. COMPARISON:  None. FINDINGS: Two fluoroscopic spot views in the operating room provided during left hip arthroplasty. Femoral stem and acetabular cup are in place. Total fluoroscopy time 14 seconds. Total dose 1.21 mGy. IMPRESSION: Procedural fluoroscopy during left hip arthroplasty. Electronically Signed   By: Keith Rake M.D.   On: 12/26/2019 15:28    Disposition: Home  Discharge Instructions    Call MD / Call 911   Complete by: As directed    If you experience chest pain or shortness of breath, CALL 911 and be transported to the hospital emergency room.  If you develope a fever above 101 F, pus (white drainage) or increased drainage or redness at the wound, or calf pain, call your surgeon's office.   Change dressing   Complete by: As directed    Maintain surgical dressing until follow up in the clinic. If the edges start to pull up, may reinforce with tape. If the dressing is no longer working, may remove and cover with gauze and tape, but must keep the area dry and clean.  Call with any questions or concerns.   Constipation Prevention   Complete by: As directed    Drink plenty of fluids.  Prune juice may be helpful.  You may use a stool softener, such as Colace (over the counter) 100 mg twice a day.  Use MiraLax (over the counter) for constipation as needed.  Diet - low sodium heart healthy   Complete by: As directed    Discharge instructions   Complete by: As directed    Maintain surgical dressing until follow up in the clinic. If the edges start to pull up, may reinforce with tape. If the dressing is no longer working, may remove  and cover with gauze and tape, but must keep the area dry and clean.  Follow up in 2 weeks at [facility]. Call with any questions or concerns.   Increase activity slowly as tolerated   Complete by: As directed    Weight bearing as tolerated with assist device (walker, cane, etc) as directed, use it as long as suggested by your surgeon or therapist, typically at least 4-6 weeks.   TED hose   Complete by: As directed    Use stockings (TED hose) for 2 weeks on both leg(s).  You may remove them at night for sleeping.       Follow-up Information    Paralee Cancel, MD. Schedule an appointment as soon as possible for a visit in 2 weeks.   Specialty: Orthopedic Surgery Contact information: [address] [place] [address] [phone]                Signed: Lucille Passy [facility] 12/27/2019, 8:20 AM

## 2019-12-27 NOTE — TOC Transition Note (Signed)
Transition of Care [facility]) - CM/SW Discharge Note   Patient Details  Name: Erika Russell MRN: [id number] Date of Birth: 1946/01/02  Transition of Care [facility]) CM/SW Contact:  Lennart Pall, LCSW Phone Number: 12/27/2019, 11:21 AM   Clinical Narrative:    Met with pt to review d/c plans.  Pt confirms need for DME (see below).  HHPT referral placed (see below).  No further TOC needs.   Final next level of care: [place] Barriers to Discharge: Barriers Resolved   Patient Goals and CMS Choice Patient states their goals for this hospitalization and ongoing recovery are:: go home CMS Medicare.gov Compare Post Acute Care list provided to:: Patient Choice offered to / list presented to : Patient  Discharge Placement                       Discharge Plan and Services                DME Arranged: 3-N-1, Walker rolling DME Agency: [employer] Date DME Agency Contacted: 12/27/19 Time DME Agency Contacted: 1157 Representative spoke with at DME Agency: Ovid Curd HH Arranged: PT [place]: [place] Date [place]: 12/27/19 Time [place] Agency Contacted: 1000 Representative spoke with at [place]: [place] ([place]) Interventions     Readmission Risk Interventions No flowsheet data found.

## 2019-12-27 NOTE — Progress Notes (Signed)
     Subjective: 1 Day Post-Op Procedure(s) (LRB): TOTAL HIP ARTHROPLASTY ANTERIOR APPROACH (Left)   Patient reports pain as mild, Pain controlled. Would like to change to Norco and stop the use of Oxycodone. Family member has a hx with Oxycodone and wishes instead to use Norco.  Discussed the abuse potential for all narcotics, she states that she understands.  No reported events throughout the night.  Dr. Alvan Dame discussed the procedure, findings and expectations moving forward.  Ready to be discharged home, if they do well with PT.  Follow up in the clinic in 2 weeks.  Knows to call with any questions or concerns.       Objective:   VITALS:   Vitals:   12/27/19 0132 12/27/19 0600  BP: (!) 120/47 (!) 134/54  Pulse: 86 67  Resp: 17 16  Temp: 98.6 F (37 C) 98.5 F (36.9 C)  SpO2: 100% 98%    Dorsiflexion/Plantar flexion intact Incision: dressing C/D/I No cellulitis present Compartment soft  LABS Recent Labs    12/27/19 0257  HGB 9.3*  HCT 29.4*  WBC 11.9*  PLT 172    Recent Labs    12/27/19 0257  NA 136  K 3.3*  BUN 12  CREATININE 0.65  GLUCOSE 197*     Assessment/Plan: 1 Day Post-Op Procedure(s) (LRB): TOTAL HIP ARTHROPLASTY ANTERIOR APPROACH (Left) Foley cath d/c'ed Advance diet Up with therapy D/C IV fluids Discharge home  Follow up in 2 weeks at [facility] Follow up with OLIN,Amylah Will D in 2 weeks.  Contact information:  [facility] [address] [phone]    Hypokalemia Treated with oral potassium          Danae Orleans PA-C  [facility]  [place] [address], [address], [place], [place] [address] Phone: [phone] [email or web address] Facebook  [employer]

## 2019-12-27 NOTE — Progress Notes (Signed)
Physical Therapy Treatment Patient Details Name: Erika Russell MRN: [id number] DOB: May 26, 1946 Today's Date: 12/27/2019    History of Present Illness 74 yo female s/p L THA-direct anterior 12/26/19.    PT Comments    Progressing well with mobility. Reviewed/practiced exercises, gait training, and stair training. Issued HEP for pt to perform at home. All education completed. Okay to d/c from PT standpoint.    Follow Up Recommendations  Home health PT     Equipment Recommendations  Rolling walker with 5" wheels    Recommendations for Other Services       Precautions / Restrictions Precautions Precautions: Fall Restrictions Weight Bearing Restrictions: No Other Position/Activity Restrictions: WBAT    Mobility  Bed Mobility Overal bed mobility: Needs Assistance Bed Mobility: Supine to Sit     Supine to sit: Min guard;HOB elevated     General bed mobility comments: oob in recliner  Transfers Overall transfer level: Needs assistance Equipment used: Rolling walker (2 wheeled) Transfers: Sit to/from Stand Sit to Stand: Min guard         General transfer comment: VCs safety, technique, hand placement.  Ambulation/Gait Ambulation/Gait assistance: Min guard Gait Distance (Feet): 125 Feet Assistive device: Rolling walker (2 wheeled) Gait Pattern/deviations: Step-to pattern;Step-through pattern;Decreased stride length     General Gait Details: Min guard for safety. VCs safety, sequence. Slow gait speed.   Stairs Stairs: Yes Stairs assistance: Min guard Stair Management: Step to pattern;Forwards;One rail Left Number of Stairs: 2 General stair comments: up and over portable stairs. vcs safety, technique, sequence.   Wheelchair Mobility    Modified Rankin (Stroke Patients Only)       Balance Overall balance assessment: Needs assistance         Standing balance support: Bilateral upper extremity supported Standing balance-Leahy Scale: Fair                               Cognition Arousal/Alertness: Awake/alert Behavior During Therapy: WFL for tasks assessed/performed Overall Cognitive Status: Within Functional Limits for tasks assessed                                        Exercises Total Joint Exercises Ankle Circles/Pumps: AROM;Both;10 reps Quad Sets: AROM;Both;10 reps Heel Slides: AROM;AAROM;Left;10 reps Hip ABduction/ADduction: AROM;Left;Standing;10 reps Knee Flexion: AROM;Left;10 reps;Standing Marching in Standing: AROM;Both;10 reps;Standing General Exercises - Lower Extremity Heel Raises: AROM;Both;10 reps;Standing    General Comments        Pertinent Vitals/Pain Pain Assessment: 0-10 Pain Score: 5  Pain Location: R hip/thigh Pain Descriptors / Indicators: Discomfort;Sore Pain Intervention(s): Monitored during session    Home Living Family/patient expects to be discharged to:: Private residence Living Arrangements: Alone   Type of Home: House Home Access: Stairs to enter Entrance Stairs-Rails: Left Home Layout: One level Home Equipment: None      Prior Function Level of Independence: Independent          PT Goals (current goals can now be found in the care plan section) Acute Rehab PT Goals Patient Stated Goal: regain independence PT Goal Formulation: With patient Time For Goal Achievement: 01/10/20 Potential to Achieve Goals: Good Progress towards PT goals: Progressing toward goals    Frequency    7X/week      PT Plan Current plan remains appropriate    Co-evaluation  AM-PAC PT "6 Clicks" Mobility   Outcome Measure  Help needed turning from your back to your side while in a flat bed without using bedrails?: A Little Help needed moving from lying on your back to sitting on the side of a flat bed without using bedrails?: A Little Help needed moving to and from a bed to a chair (including a wheelchair)?: A Little Help needed standing up  from a chair using your arms (e.g., wheelchair or bedside chair)?: A Little Help needed to walk in hospital room?: A Little Help needed climbing 3-5 steps with a railing? : A Little 6 Click Score: 18    End of Session Equipment Utilized During Treatment: Gait belt Activity Tolerance: Patient tolerated treatment well Patient left: in chair;with call bell/phone within reach;with family/visitor present   PT Visit Diagnosis: Other abnormalities of gait and mobility (R26.89);Pain Pain - Right/Left: Left Pain - part of body: Hip     Time: 4268-3419 PT Time Calculation (min) (ACUTE ONLY): 19 min  Charges:  $Gait Training: 8-22 mins                         Doreatha Massed, PT Acute Rehabilitation  Office: [phone] Pager: [phone]

## 2019-12-27 NOTE — Progress Notes (Signed)
Patient discharged to home w/ family. Given all belongings, instructions, equipment. Verbalized understanding of all instructions. Escorted to pov via w/c. 

## 2019-12-28 ENCOUNTER — Encounter (HOSPITAL_COMMUNITY): Payer: Self-pay | Admitting: Orthopedic Surgery

## 2020-03-18 ENCOUNTER — Ambulatory Visit
Admission: RE | Admit: 2020-03-18 | Discharge: 2020-03-18 | Disposition: A | Discharge status: Home / Self Care | Payer: Medicare Other | Source: Ambulatory Visit | Attending: Internal Medicine | Admitting: Internal Medicine

## 2020-03-18 ENCOUNTER — Other Ambulatory Visit: Payer: Self-pay | Admitting: Internal Medicine

## 2020-03-18 DIAGNOSIS — R3129 Other microscopic hematuria: Secondary | ICD-10-CM

## 2020-03-18 DIAGNOSIS — R109 Unspecified abdominal pain: Secondary | ICD-10-CM

## 2020-09-18 IMAGING — RF DG HIP (WITH PELVIS) OPERATIVE*L*
1 series · 2 of 2 positions shown · non-contrast
Comparison: None.

CLINICAL DATA: Left hip replacement.

EXAM:
OPERATIVE LEFT HIP (WITH PELVIS IF PERFORMED)
TECHNIQUE: Fluoroscopic spot image(s) were submitted for interpretation
post-operatively.

[Series 1: unknown protocol · 0.20mm/px · 2 of 2 slices shown]
[im 1/2]
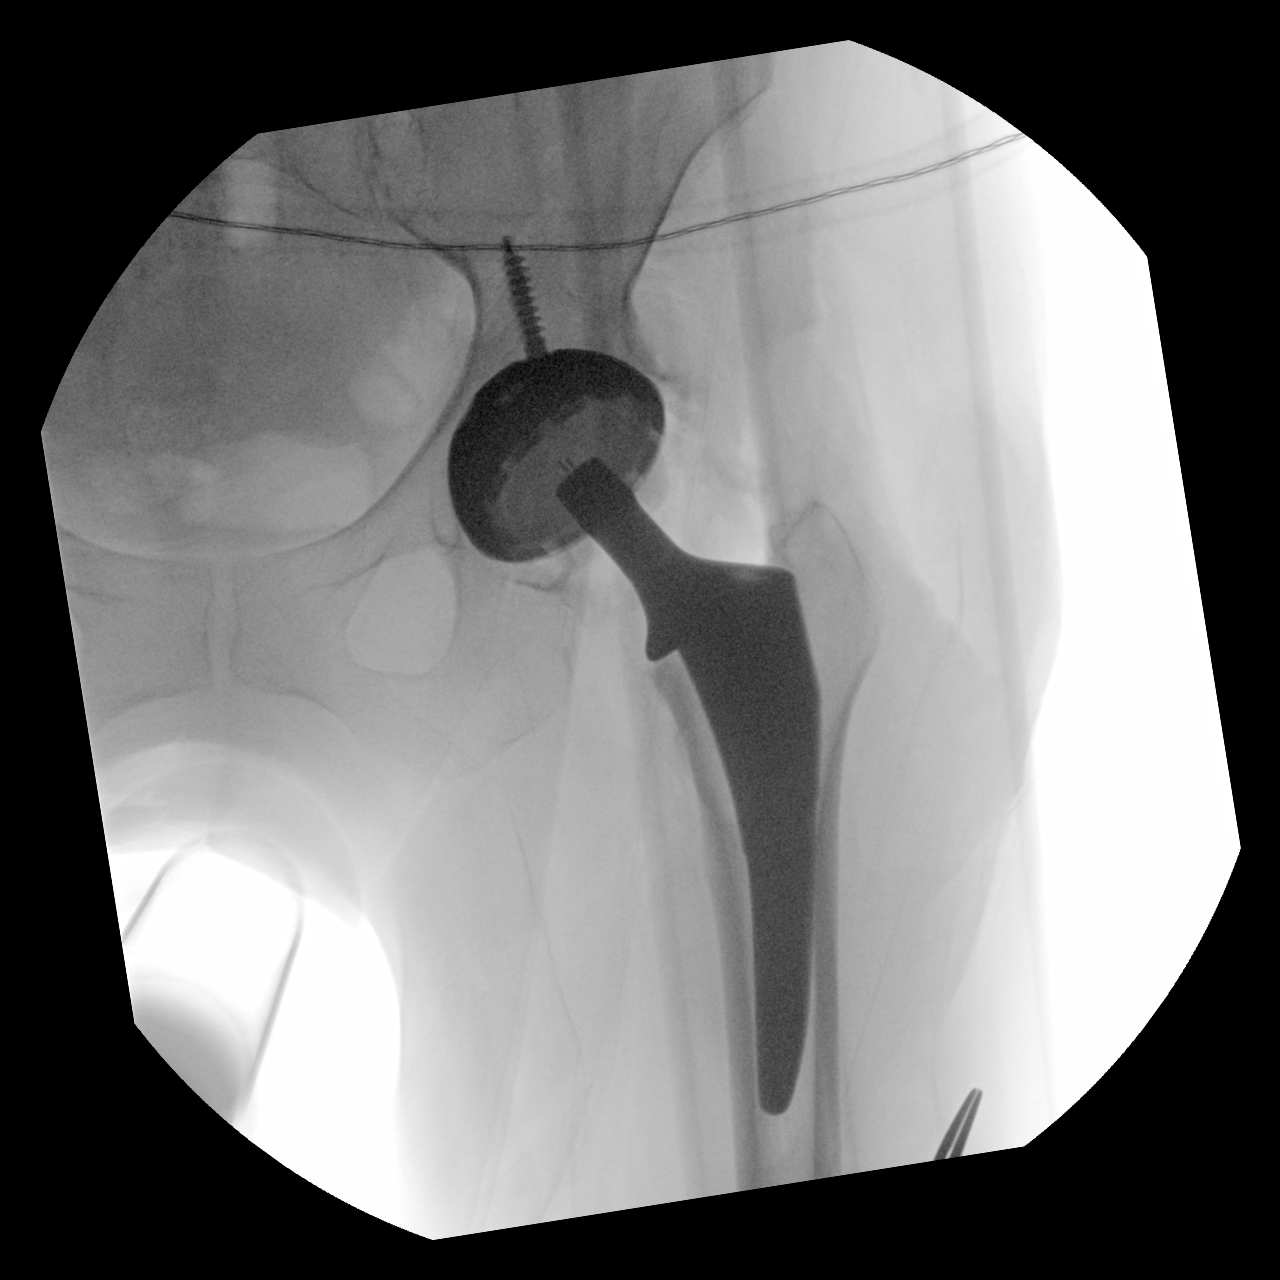
[im 2/2]
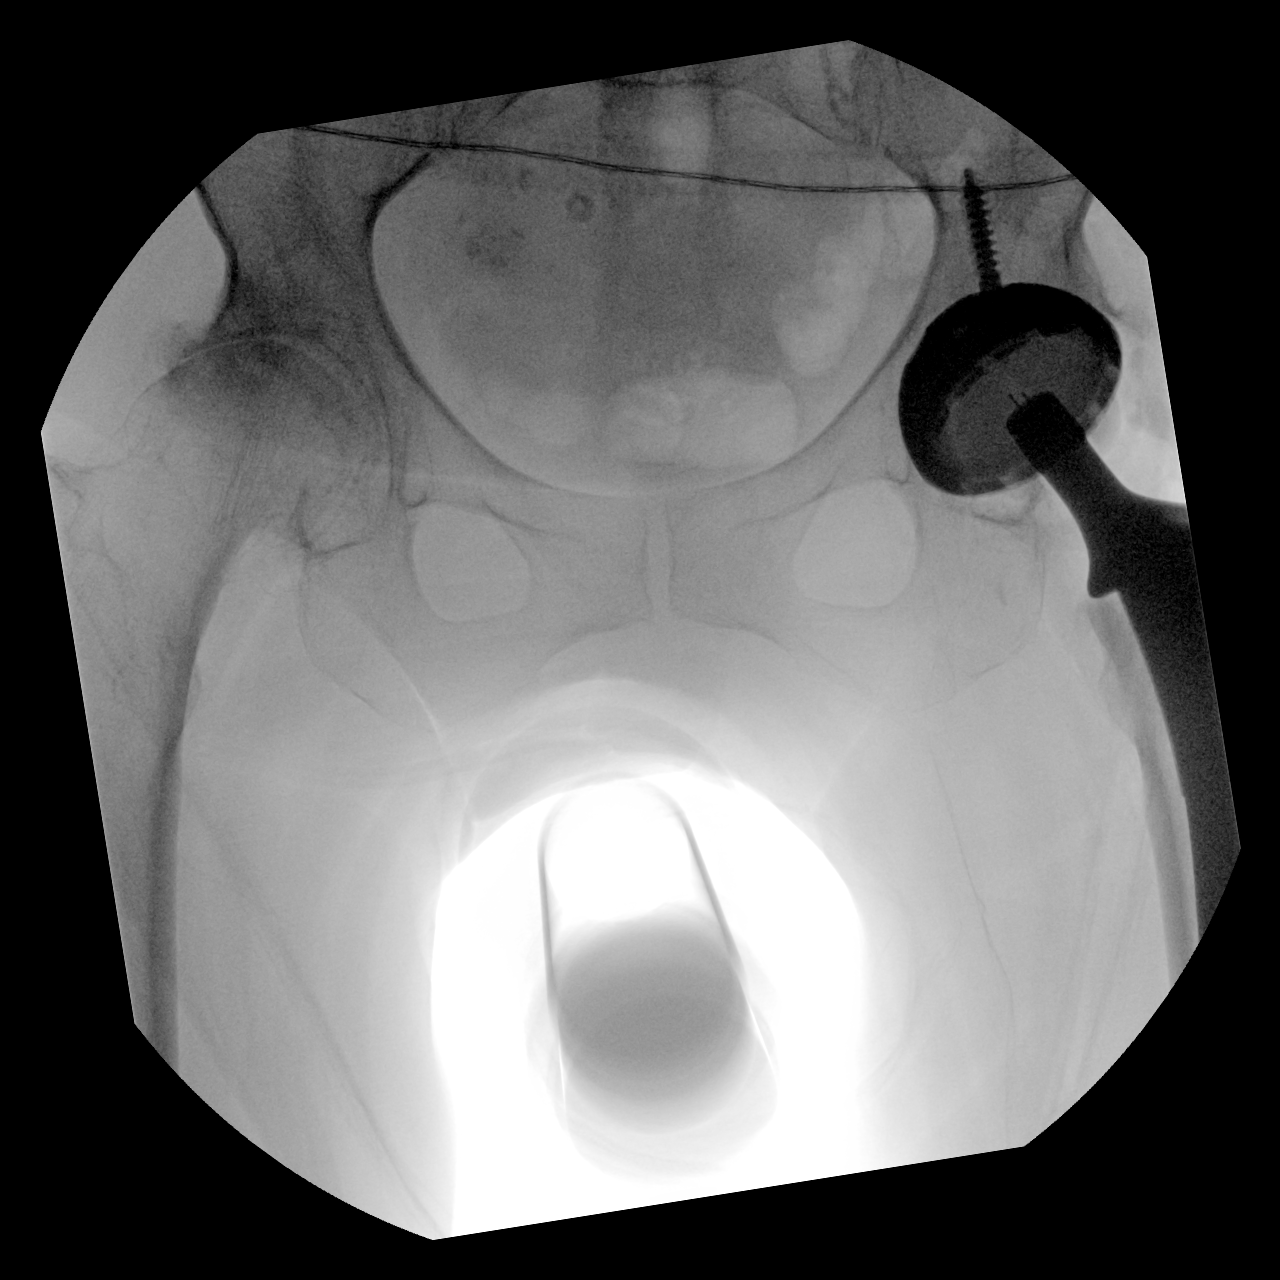

[2 of 2 positions shown; findings below may reference images not displayed]

FINDINGS: Two fluoroscopic spot views in the operating room provided during
left hip arthroplasty. Femoral stem and acetabular cup are in place.
Total fluoroscopy time 14 seconds. Total dose 1.21 mGy.
IMPRESSION: Procedural fluoroscopy during left hip arthroplasty.

## 2020-09-18 IMAGING — DX DG PORTABLE PELVIS
1 series · 1 of 1 positions shown · non-contrast
Comparison: None.

CLINICAL DATA: Left hip replacement.

EXAM:
PORTABLE PELVIS 1-2 VIEWS

[pelvis ap]
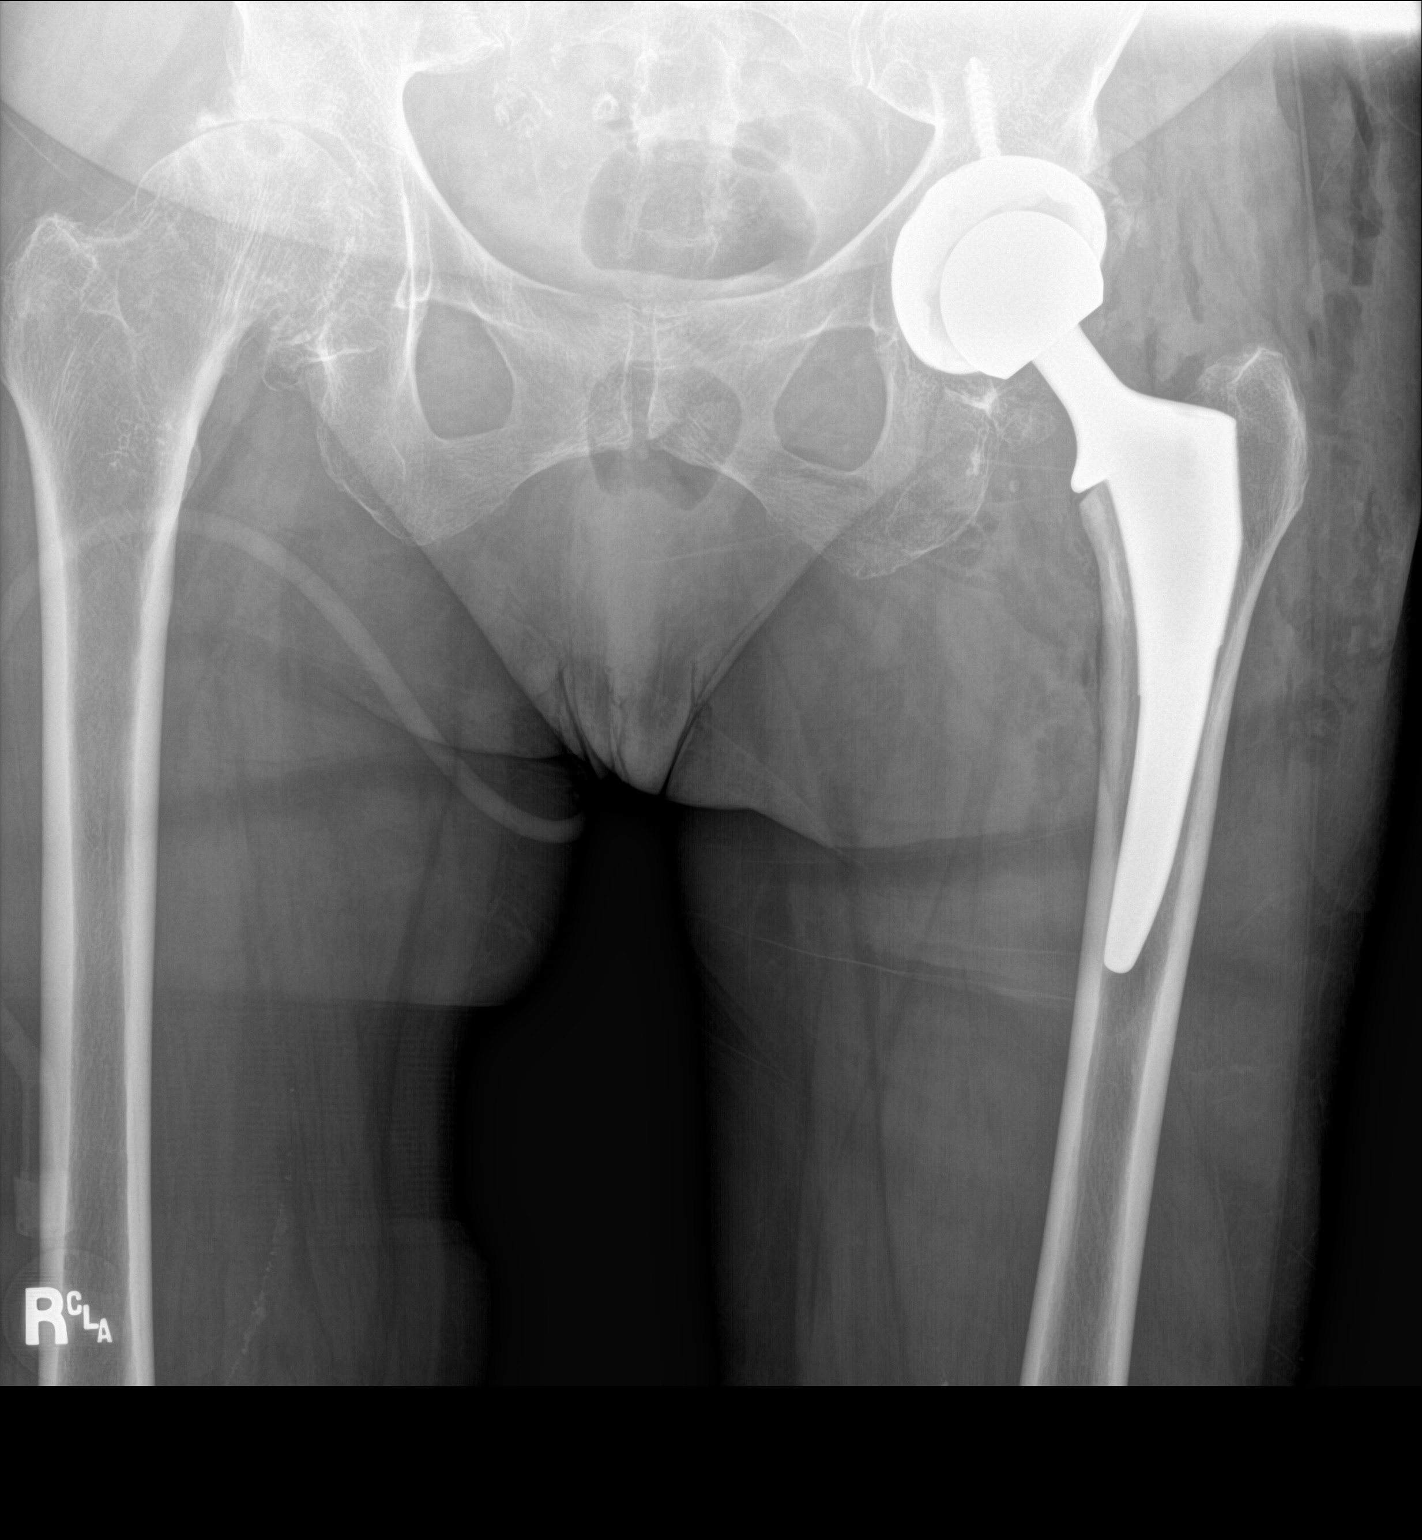

[1 of 1 positions shown; findings below may reference images not displayed]

FINDINGS: Left hip arthroplasty in expected alignment. No periprosthetic
lucency or fracture. Recent postsurgical change includes air and
edema in the joint space and soft tissues. Advanced right hip
osteoarthritis is also noted.
IMPRESSION: Left hip arthroplasty without immediate postoperative complication.

## 2021-10-05 ENCOUNTER — Other Ambulatory Visit: Payer: Self-pay

## 2021-10-05 ENCOUNTER — Emergency Department (HOSPITAL_BASED_OUTPATIENT_CLINIC_OR_DEPARTMENT_OTHER)
Admission: EM | Admit: 2021-10-05 | Discharge: 2021-10-05 | Disposition: A | Discharge status: Home / Self Care | Payer: Medicare Other | Attending: Emergency Medicine | Admitting: Emergency Medicine

## 2021-10-05 ENCOUNTER — Encounter (HOSPITAL_BASED_OUTPATIENT_CLINIC_OR_DEPARTMENT_OTHER): Payer: Self-pay | Admitting: Emergency Medicine

## 2021-10-05 DIAGNOSIS — I10 Essential (primary) hypertension: Secondary | ICD-10-CM | POA: Insufficient documentation

## 2021-10-05 DIAGNOSIS — K5909 Other constipation: Secondary | ICD-10-CM

## 2021-10-05 DIAGNOSIS — K5900 Constipation, unspecified: Secondary | ICD-10-CM | POA: Diagnosis not present

## 2021-10-05 DIAGNOSIS — Z79899 Other long term (current) drug therapy: Secondary | ICD-10-CM | POA: Insufficient documentation

## 2021-10-05 DIAGNOSIS — K625 Hemorrhage of anus and rectum: Secondary | ICD-10-CM | POA: Insufficient documentation

## 2021-10-05 LAB — CBC
HCT: 35.8 % — ABNORMAL LOW (ref 36.0–46.0)
Hemoglobin: 11.7 g/dL — ABNORMAL LOW (ref 12.0–15.0)
MCH: 21.5 pg — ABNORMAL LOW (ref 26.0–34.0)
MCHC: 32.7 g/dL (ref 30.0–36.0)
MCV: 65.8 fL — ABNORMAL LOW (ref 80.0–100.0)
Platelets: 188 10*3/uL (ref 150–400)
RBC: 5.44 MIL/uL — ABNORMAL HIGH (ref 3.87–5.11)
RDW: 16.3 % — ABNORMAL HIGH (ref 11.5–15.5)
WBC: 6.2 10*3/uL (ref 4.0–10.5)
nRBC: 0 % (ref 0.0–0.2)

## 2021-10-05 LAB — BASIC METABOLIC PANEL
Anion gap: 3 — ABNORMAL LOW (ref 5–15)
BUN: 14 mg/dL (ref 8–23)
CO2: 24 mmol/L (ref 22–32)
Calcium: 9.4 mg/dL (ref 8.9–10.3)
Chloride: 109 mmol/L (ref 98–111)
Creatinine, Ser: 0.78 mg/dL (ref 0.44–1.00)
GFR, Estimated: >60 mL/min (ref >60)
Glucose, Bld: 85 mg/dL (ref 70–99)
Potassium: 4 mmol/L (ref 3.5–5.1)
Sodium: 136 mmol/L (ref 135–145)

## 2021-10-05 LAB — OCCULT BLOOD X 1 CARD TO LAB, STOOL: Fecal Occult Bld: NEGATIVE

## 2021-10-05 NOTE — ED Notes (Signed)
MSE waiver explained. Pt verbalizes understanding. Signature pad not functioning  ?

## 2021-10-05 NOTE — ED Triage Notes (Signed)
Pt arrives pov, slow gait with c/o rectal bleeding and pressure x 2 days.Took stool softener today. Denies thinners ?

## 2021-10-05 NOTE — Discharge Instructions (Addendum)
It was our pleasure to provide your ER care today - we hope that you feel better. ? ?For constipation, make sure to stay well hydrated/drink plenty of water, get adequate fiber in diet, take colace (stool softener) 2x/day, and take miralax (laxative) once per day as need - these meds are available over the counter.  ? ?Follow up with your doctor/gi doctor in 2-3 weeks if symptoms fail to improve/resolve. ? ?Return to ER if worse, new symptoms, fevers, heavy rectal bleeding, severe abdominal pain, persistent vomiting, weak/fainting, or other concern.  ?

## 2021-10-05 NOTE — ED Provider Notes (Signed)
?[place] [facility] ?Provider Note ? ? ?CSN: [id number] ?Arrival date & time: 10/05/21  1047 ? ?  ? ?History ? ?Chief Complaint  ?Patient presents with  ? Rectal Bleeding  ? ? ?Erika Russell is a 76 y.o. female. ? ?Pt notes recent mild constipation/straining, and then has noticed streaks bright red blood w bm. Denies melena or grossly red/bloody stools - indicating stool has been normal color. Denies abd pain or distension. No vomiting. No fever or chills. No faintness or dizziness. Had had colonoscopy in past. No hx colon ca, hx 'colitis', hx hemorrhoid.  ? ?The history is provided by the patient and medical records.  ?Rectal Bleeding ?Associated symptoms: no abdominal pain, no epistaxis, no fever, no light-headedness and no vomiting   ? ?  ? ?Home Medications ?Prior to Admission medications   ?Medication Sig Start Date End Date Taking? Authorizing Provider  ?amLODipine (NORVASC) 5 MG tablet Take 5 mg by mouth daily.    [provider]  ?ascorbic acid (VITAMIN C) 500 MG tablet Take 5,000 mg by mouth daily.    [provider]  ?atorvastatin (LIPITOR) 40 MG tablet Take 20 mg by mouth daily.     [provider]  ?cetirizine (ZYRTEC) 10 MG tablet Take 10 mg by mouth daily as needed for allergies.     [provider]  ?cholecalciferol (VITAMIN D) 25 MCG (1000 UNIT) tablet Take 1,000 Units by mouth daily.    [provider]  ?cholestyramine (QUESTRAN) 4 g packet Take 4 g by mouth 2 (two) times daily as needed (colitis).  07/27/19   [provider]  ?docusate sodium (COLACE) 100 MG capsule Take 1 capsule (100 mg total) by mouth 2 (two) times daily. 12/27/19   Danae Orleans, PA-C  ?ferrous sulfate (FERROUSUL) 325 (65 FE) MG tablet Take 1 tablet (325 mg total) by mouth 3 (three) times daily with meals for 14 days. 12/27/19 01/10/20  Danae Orleans, PA-C  ?glycopyrrolate (ROBINUL) 2 MG tablet Take 2 mg by mouth 3 (three) times daily as needed (for  colitis).     [provider]  ?HYDROcodone-acetaminophen (NORCO) 7.5-325 MG tablet Take 1-2 tablets by mouth every 4 (four) hours as needed for moderate pain. 12/27/19   Danae Orleans, PA-C  ?lidocaine (LIDODERM) 5 % Place 1 patch onto the skin daily as needed (back pain.).  07/27/19   [provider]  ?MAGNESIUM-OXIDE 400 (241.3 Mg) MG tablet Take 800 mg by mouth daily. 10/20/19   [provider]  ?methocarbamol (ROBAXIN) 500 MG tablet Take 1 tablet (500 mg total) by mouth every 6 (six) hours as needed for muscle spasms. 12/27/19   Danae Orleans, PA-C  ?metoprolol tartrate (LOPRESSOR) 50 MG tablet Take 50 mg by mouth 2 (two) times daily. 09/11/19   [provider]  ?Multiple Vitamin (MULTIVITAMIN WITH MINERALS) TABS tablet Take 1 tablet by mouth daily.    [provider]  ?nystatin-triamcinolone ointment (MYCOLOG) Apply 1 application topically 2 (two) times daily. ?Patient taking differently: Apply 1 application topically 2 (two) times daily as needed (irritation.).  11/23/18   [name], [name], MD  ?olmesartan (BENICAR) 40 MG tablet Take 40 mg by mouth daily.    [provider]  ?Omega-3 Fatty Acids (FISH OIL) 1000 MG CAPS Take 1,000 mg by mouth daily.    [provider]  ?omeprazole (PRILOSEC) 40 MG capsule Take 40 mg by mouth daily as needed (heartburn/indigestion.).    [provider]  ?polyethylene glycol (  MIRALAX / GLYCOLAX) 17 g packet Take 17 g by mouth 2 (two) times daily. 12/27/19   Danae Orleans, PA-C  ?potassium chloride SA (K-DUR,KLOR-CON) 20 MEQ tablet Take 2 tablets (40 mEq total) by mouth daily. ?Patient taking differently: Take 20 mEq by mouth in the morning and at bedtime.  06/05/17   [name], PA-C  ?Probiotic Product (PROBIOTIC PO) Take 1 capsule by mouth daily.    [provider]  ?RESTASIS 0.05 % ophthalmic emulsion Place 1 drop into both eyes every 12 (twelve) hours.  11/06/19   [provider]   ?sodium chloride (OCEAN) 0.65 % SOLN nasal spray Place 2 sprays into both nostrils 4 (four) times daily as needed for congestion.     [provider]  ?   ? ?Allergies    ?Codeine and Pyridium [phenazopyridine]   ? ?Review of Systems   ?Review of Systems  ?Constitutional:  Negative for fever.  ?HENT:  Negative for nosebleeds.   ?Gastrointestinal:  Positive for constipation and hematochezia. Negative for abdominal pain and vomiting.  ?Genitourinary:  Negative for hematuria.  ?Neurological:  Negative for light-headedness.  ? ?Physical Exam ?Updated Vital Signs ?BP (!) 155/65 (BP Location: Left Arm)   Pulse 73   Temp 98.6 ?F (37 ?C)   Resp 18   Ht 1.6 m ('5\' 3"'$ )   Wt 60.3 kg   SpO2 100%   BMI 23.56 kg/m?  ?Physical Exam ?Vitals and nursing note reviewed.  ?Constitutional:   ?   Appearance: Normal appearance. She is well-developed.  ?HENT:  ?   Head: Atraumatic.  ?   Nose: Nose normal.  ?   Mouth/Throat:  ?   Mouth: Mucous membranes are moist.  ?Eyes:  ?   General: No scleral icterus. ?   Conjunctiva/sclera: Conjunctivae normal.  ?Neck:  ?   Trachea: No tracheal deviation.  ?Cardiovascular:  ?   Rate and Rhythm: Normal rate.  ?   Pulses: Normal pulses.  ?Pulmonary:  ?   Effort: Pulmonary effort is normal. No respiratory distress.  ?Abdominal:  ?   General: Bowel sounds are normal. There is no distension.  ?   Palpations: Abdomen is soft. There is no mass.  ?   Tenderness: There is no abdominal tenderness. There is no guarding.  ?Genitourinary: ?   Comments: No cva tenderness. Chaperoned, by RN, rectal exam - light brown stool, tiny streak brb. No mass felt. Stool is firm.  ?Musculoskeletal:     ?   General: No swelling.  ?   Cervical back: Normal range of motion and neck supple. No rigidity. No muscular tenderness.  ?Skin: ?   General: Skin is warm and dry.  ?   Findings: No rash.  ?Neurological:  ?   Mental Status: She is alert.  ?   Comments: Alert, speech normal.   ?Psychiatric:     ?   Mood and  Affect: Mood normal.  ? ? ?ED Results / Procedures / Treatments   ?Labs ?(all labs ordered are listed, but only abnormal results are displayed) ?Results for orders placed or performed during the hospital encounter of 10/05/21  ?Basic metabolic panel  ?Result Value Ref Range  ? Sodium 136 135 - 145 mmol/L  ? Potassium 4.0 3.5 - 5.1 mmol/L  ? Chloride 109 98 - 111 mmol/L  ? CO2 24 22 - 32 mmol/L  ? Glucose, Bld 85 70 - 99 mg/dL  ? BUN 14 8 - 23 mg/dL  ? Creatinine, Ser  0.78 0.44 - 1.00 mg/dL  ? Calcium 9.4 8.9 - 10.3 mg/dL  ? GFR, Estimated >60 >60 mL/min  ? Anion gap 3 (L) 5 - 15  ?CBC  ?Result Value Ref Range  ? WBC 6.2 4.0 - 10.5 K/uL  ? RBC 5.44 (H) 3.87 - 5.11 MIL/uL  ? Hemoglobin 11.7 (L) 12.0 - 15.0 g/dL  ? HCT 35.8 (L) 36.0 - 46.0 %  ? MCV 65.8 (L) 80.0 - 100.0 fL  ? MCH 21.5 (L) 26.0 - 34.0 pg  ? MCHC 32.7 30.0 - 36.0 g/dL  ? RDW 16.3 (H) 11.5 - 15.5 %  ? Platelets 188 150 - 400 K/uL  ? nRBC 0.0 0.0 - 0.2 %  ?Occult blood card to lab, stool Provider will collect  ?Result Value Ref Range  ? Fecal Occult Bld NEGATIVE NEGATIVE  ? ? ? ?EKG ?None ? ?Radiology ?No results found. ? ?Procedures ?Procedures  ? ? ?Medications Ordered in ED ?Medications - No data to display ? ?ED Course/ Medical Decision Making/ A&P ?  ?                        ?Medical Decision Making ?Problems Addressed: ?Essential hypertension: chronic illness or injury that poses a threat to life or bodily functions ?Other constipation: acute illness or injury with systemic symptoms ?Rectal bleeding: acute illness or injury with systemic symptoms that poses a threat to life or bodily functions ? ?Amount and/or Complexity of Data Reviewed ?External Data Reviewed: labs and notes. ?Labs: ordered. Decision-making details documented in ED Course. ? ?Risk ?OTC drugs. ?Decision regarding hospitalization. ? ? ?Iv ns. Continuous pulse ox and cardiac monitoring. Labs ordered/sent.  ? ?Differential diagnosis includes rectal bleeding, diverticular bleeding,  rectal mass, hemorrhoids, etc. - disposition decision including potential need for admission considered if labs show marked drop in hgb or other acute abn - will check labs, and reassess.  ? ?Reviewed nursing notes

## 2023-11-17 ENCOUNTER — Encounter (HOSPITAL_BASED_OUTPATIENT_CLINIC_OR_DEPARTMENT_OTHER): Payer: Self-pay | Admitting: Emergency Medicine

## 2023-11-17 ENCOUNTER — Other Ambulatory Visit: Payer: Self-pay

## 2023-11-17 ENCOUNTER — Emergency Department (HOSPITAL_BASED_OUTPATIENT_CLINIC_OR_DEPARTMENT_OTHER)
Admission: EM | Admit: 2023-11-17 | Discharge: 2023-11-17 | Disposition: A | Discharge status: Home / Self Care | Attending: Emergency Medicine | Admitting: Emergency Medicine

## 2023-11-17 DIAGNOSIS — R1013 Epigastric pain: Secondary | ICD-10-CM | POA: Insufficient documentation

## 2023-11-17 DIAGNOSIS — I2510 Atherosclerotic heart disease of native coronary artery without angina pectoris: Secondary | ICD-10-CM | POA: Diagnosis not present

## 2023-11-17 DIAGNOSIS — I10 Essential (primary) hypertension: Secondary | ICD-10-CM | POA: Diagnosis not present

## 2023-11-17 LAB — CBC
HCT: 34.1 % — ABNORMAL LOW (ref 36.0–46.0)
Hemoglobin: 11.2 g/dL — ABNORMAL LOW (ref 12.0–15.0)
MCH: 21.4 pg — ABNORMAL LOW (ref 26.0–34.0)
MCHC: 32.8 g/dL (ref 30.0–36.0)
MCV: 65.1 fL — ABNORMAL LOW (ref 80.0–100.0)
Platelets: 188 10*3/uL (ref 150–400)
RBC: 5.24 MIL/uL — ABNORMAL HIGH (ref 3.87–5.11)
RDW: 15.9 % — ABNORMAL HIGH (ref 11.5–15.5)
WBC: 5.5 10*3/uL (ref 4.0–10.5)
nRBC: 0 % (ref 0.0–0.2)

## 2023-11-17 LAB — COMPREHENSIVE METABOLIC PANEL WITH GFR
ALT: 16 U/L (ref 0–44)
AST: 26 U/L (ref 15–41)
Albumin: 4.3 g/dL (ref 3.5–5.0)
Alkaline Phosphatase: 75 U/L (ref 38–126)
Anion gap: 11 (ref 5–15)
BUN: 13 mg/dL (ref 8–23)
CO2: 24 mmol/L (ref 22–32)
Calcium: 9.6 mg/dL (ref 8.9–10.3)
Chloride: 101 mmol/L (ref 98–111)
Creatinine, Ser: 0.68 mg/dL (ref 0.44–1.00)
GFR, Estimated: >60 mL/min (ref >60)
Glucose, Bld: 97 mg/dL (ref 70–99)
Potassium: 4.2 mmol/L (ref 3.5–5.1)
Sodium: 136 mmol/L (ref 135–145)
Total Bilirubin: 0.4 mg/dL (ref 0.0–1.2)
Total Protein: 6.9 g/dL (ref 6.5–8.1)

## 2023-11-17 LAB — LIPASE, BLOOD: Lipase: 43 U/L (ref 11–51)

## 2023-11-17 MED ORDER — SUCRALFATE 1 G PO TABS: 1.0000 g | ORAL_TABLET | Freq: Three times a day (TID) | ORAL | 0 refills | Status: AC

## 2023-11-17 MED ORDER — ALUM & MAG HYDROXIDE-SIMETH 200-200-20 MG/5ML PO SUSP
30.0000 mL | Freq: Once | ORAL | Status: AC
  Administered 2023-11-17: 30 mL via ORAL
  Filled 2023-11-17: qty 30

## 2023-11-17 MED ORDER — SODIUM CHLORIDE 0.9 % IV BOLUS
500.0000 mL | Freq: Once | INTRAVENOUS | Status: AC
  Administered 2023-11-17: 500 mL via INTRAVENOUS

## 2023-11-17 MED ORDER — ONDANSETRON 4 MG PO TBDP: 4.0000 mg | ORAL_TABLET | Freq: Three times a day (TID) | ORAL | 0 refills | Status: AC | PRN

## 2023-11-17 NOTE — Discharge Instructions (Signed)

## 2023-11-17 NOTE — ED Triage Notes (Signed)
 Pt POV- c/o abd discomfort with gas, burning. Pt dx with colitis with diarrhea. Denies n/v.  Good po intake. Denies known fever.   Took mylanta this AM, some relief.   Scheduled for endoscopy/colonoscopy in July.

## 2023-11-17 NOTE — ED Provider Notes (Signed)
 Emergency Department Provider Note   I have reviewed the triage vital signs and the nursing notes.   HISTORY  Chief Complaint Abdominal Pain   HPI Erika Russell is a 78 y.o. female with past history reviewed below presents to the emergency department with epigastric gas-like discomfort.  She has some associated burning pain no radiation into the chest.  No nausea or vomiting.  She is eating and drinking well without difficulty.  She has had symptoms like this in the past related to gastritis.  She takes Protonix daily.  She has an appointment with GI to be seen in July for an upper endoscopy.  No fevers.  No shortness of breath.   Past Medical History:  Diagnosis Date   Arthritis    lower back ,hips   Cervical dysplasia 1988   Colitis    collagenous   Coronary artery disease    blockage of RCA '09   Elevated cholesterol    Heart block    History of kidney stones 2020   Hypertension    IBS (irritable bowel syndrome)    Kidney stone    Myocardial infarction (HCC)    Osteopenia 11/2018   T score -1.8 FRAX 4.7% / 1.1%.  Overall stable from prior DEXA    Review of Systems  Constitutional: No fever/chills Cardiovascular: Denies chest pain. Respiratory: Denies shortness of breath. Gastrointestinal: Positive epigastric abdominal pain. Positive nausea, no vomiting.  No diarrhea.  No constipation. Skin: Negative for rash. Neurological: Negative for headaches.  ____________________________________________   PHYSICAL EXAM:  VITAL SIGNS: ED Triage Vitals  Encounter Vitals Group     BP 11/17/23 1315 (!) 147/65     Pulse Rate 11/17/23 1315 72     Resp 11/17/23 1315 15     Temp 11/17/23 1315 98.1 F (36.7 C)     Temp src --      SpO2 11/17/23 1315 100 %     Weight 11/17/23 1316 126 lb (57.2 kg)     Height 11/17/23 1316 5' 3 (1.6 m)   Constitutional: Alert and oriented. Well appearing and in no acute distress. Eyes: Conjunctivae are normal Head:  Atraumatic. Nose: No congestion/rhinnorhea. Mouth/Throat: Mucous membranes are moist.   Neck: No stridor.   Cardiovascular: Normal rate, regular rhythm. Good peripheral circulation. Grossly normal heart sounds.   Respiratory: Normal respiratory effort.  No retractions. Lungs CTAB. Gastrointestinal: Soft and nontender. No distention.  Musculoskeletal: No lower extremity tenderness nor edema. No gross deformities of extremities. Neurologic:  Normal speech and language. No gross focal neurologic deficits are appreciated.  Skin:  Skin is warm, dry and intact. No rash noted.  ____________________________________________   LABS (all labs ordered are listed, but only abnormal results are displayed)  Labs Reviewed  CBC - Abnormal; Notable for the following components:      Result Value   RBC 5.24 (*)    Hemoglobin 11.2 (*)    HCT 34.1 (*)    MCV 65.1 (*)    MCH 21.4 (*)    RDW 15.9 (*)    All other components within normal limits  LIPASE, BLOOD  COMPREHENSIVE METABOLIC PANEL WITH GFR  URINALYSIS, ROUTINE W REFLEX MICROSCOPIC   ____________________________________________  EKG   EKG Interpretation Date/Time:  Wednesday November 17 2023 13:22:43 EDT Ventricular Rate:  61 PR Interval:  150 QRS Duration:  87 QT Interval:  434 QTC Calculation: 438 R Axis:   47  Text Interpretation: Sinus rhythm Abnormal T, consider ischemia, diffuse leads  T wave changes similar to prior Confirmed by Abby Hocking [phone]) on 11/17/2023 1:44:54 PM        ____________________________________________  RADIOLOGY  No results found.  ____________________________________________   PROCEDURES  Procedure(s) performed:   Procedures  None  ____________________________________________   INITIAL IMPRESSION / ASSESSMENT AND PLAN / ED COURSE  Pertinent labs & imaging results that were available during my care of the patient were reviewed by me and considered in my medical decision making (see  chart for details).   This patient is Presenting for Evaluation of epigastric abdominal pain, which does require a range of treatment options, and is a complaint that involves a high risk of morbidity and mortality.  The Differential Diagnoses includes but is not exclusive to acute cholecystitis, intrathoracic causes for epigastric abdominal pain, gastritis, duodenitis, pancreatitis, small bowel or large bowel obstruction, abdominal aortic aneurysm, hernia, gastritis, etc.   Critical Interventions-    Medications  sodium chloride  0.9 % bolus 500 mL (has no administration in time range)  alum & mag hydroxide-simeth (MAALOX/MYLANTA) 200-200-20 MG/5ML suspension 30 mL (30 mLs Oral Given 11/17/23 1509)    Reassessment after intervention:  symptoms improved.   Clinical Laboratory Tests Ordered, included CBC without leukocytosis.  Mild anemia 11.2.  Creatinine normal.  LFTs, bilirubin normal. Lipase normal.    Cardiac Monitor Tracing which shows NSR.   Medical Decision Making: Summary:  The patient presents to the emergency department with epigastric gas-like discomfort not radiating to the chest.  EKG similar to prior tracings in the past.  Symptoms improved with IV fluid and Maalox.  Plan to start Carafate over the next week.  The patient has GI follow-up in July for upper endoscopy. Appears stable for discharge.    Patient's presentation is most consistent with acute presentation with potential threat to life or bodily function.   Disposition: discharge  ____________________________________________  FINAL CLINICAL IMPRESSION(S) / ED DIAGNOSES  Final diagnoses:  Epigastric pain     NEW OUTPATIENT MEDICATIONS STARTED DURING THIS VISIT:  New Prescriptions   ONDANSETRON  (ZOFRAN -ODT) 4 MG DISINTEGRATING TABLET    Take 1 tablet (4 mg total) by mouth every 8 (eight) hours as needed.   SUCRALFATE (CARAFATE) 1 G TABLET    Take 1 tablet (1 g total) by mouth 4 (four) times daily -  with  meals and at bedtime for 7 days.    Note:  This document was prepared using Dragon voice recognition software and may include unintentional dictation errors.  Abby Hocking, MD, [facility]    Brasen Bundren, Shereen Dike, MD 11/17/23 [phone]

## 2024-04-02 ENCOUNTER — Other Ambulatory Visit: Payer: Self-pay

## 2024-04-02 ENCOUNTER — Emergency Department (HOSPITAL_BASED_OUTPATIENT_CLINIC_OR_DEPARTMENT_OTHER)
Admission: EM | Admit: 2024-04-02 | Discharge: 2024-04-02 | Disposition: A | Discharge status: Home / Self Care | Attending: Emergency Medicine | Admitting: Emergency Medicine

## 2024-04-02 ENCOUNTER — Encounter (HOSPITAL_BASED_OUTPATIENT_CLINIC_OR_DEPARTMENT_OTHER): Payer: Self-pay

## 2024-04-02 ENCOUNTER — Emergency Department (HOSPITAL_BASED_OUTPATIENT_CLINIC_OR_DEPARTMENT_OTHER)

## 2024-04-02 DIAGNOSIS — R10A1 Flank pain, right side: Secondary | ICD-10-CM | POA: Insufficient documentation

## 2024-04-02 DIAGNOSIS — Z79899 Other long term (current) drug therapy: Secondary | ICD-10-CM | POA: Diagnosis not present

## 2024-04-02 DIAGNOSIS — M545 Low back pain, unspecified: Secondary | ICD-10-CM | POA: Diagnosis present

## 2024-04-02 DIAGNOSIS — I10 Essential (primary) hypertension: Secondary | ICD-10-CM | POA: Diagnosis not present

## 2024-04-02 DIAGNOSIS — Z955 Presence of coronary angioplasty implant and graft: Secondary | ICD-10-CM | POA: Diagnosis not present

## 2024-04-02 DIAGNOSIS — R109 Unspecified abdominal pain: Secondary | ICD-10-CM | POA: Insufficient documentation

## 2024-04-02 DIAGNOSIS — I2510 Atherosclerotic heart disease of native coronary artery without angina pectoris: Secondary | ICD-10-CM | POA: Insufficient documentation

## 2024-04-02 LAB — CBC WITH DIFFERENTIAL/PLATELET
Abs Immature Granulocytes: 0.02 K/uL (ref 0.00–0.07)
Basophils Absolute: 0 K/uL (ref 0.0–0.1)
Basophils Relative: 1 %
Eosinophils Absolute: 0 K/uL (ref 0.0–0.5)
Eosinophils Relative: 1 %
HCT: 35.3 % — ABNORMAL LOW (ref 36.0–46.0)
Hemoglobin: 11.5 g/dL — ABNORMAL LOW (ref 12.0–15.0)
Immature Granulocytes: 0 %
Lymphocytes Relative: 37 %
Lymphs Abs: 2.2 K/uL (ref 0.7–4.0)
MCH: 21.3 pg — ABNORMAL LOW (ref 26.0–34.0)
MCHC: 32.6 g/dL (ref 30.0–36.0)
MCV: 65.2 fL — ABNORMAL LOW (ref 80.0–100.0)
Monocytes Absolute: 0.6 K/uL (ref 0.1–1.0)
Monocytes Relative: 10 %
Neutro Abs: 3.1 K/uL (ref 1.7–7.7)
Neutrophils Relative %: 51 %
Platelets: 187 K/uL (ref 150–400)
RBC: 5.41 MIL/uL — ABNORMAL HIGH (ref 3.87–5.11)
RDW: 16.1 % — ABNORMAL HIGH (ref 11.5–15.5)
Smear Review: NORMAL
WBC: 6.1 K/uL (ref 4.0–10.5)
nRBC: 0 % (ref 0.0–0.2)

## 2024-04-02 LAB — COMPREHENSIVE METABOLIC PANEL WITH GFR
ALT: 14 U/L (ref 0–44)
AST: 27 U/L (ref 15–41)
Albumin: 4.6 g/dL (ref 3.5–5.0)
Alkaline Phosphatase: 69 U/L (ref 38–126)
Anion gap: 13 (ref 5–15)
BUN: 10 mg/dL (ref 8–23)
CO2: 23 mmol/L (ref 22–32)
Calcium: 9.3 mg/dL (ref 8.9–10.3)
Chloride: 103 mmol/L (ref 98–111)
Creatinine, Ser: 0.59 mg/dL (ref 0.44–1.00)
GFR, Estimated: >60 mL/min (ref >60)
Glucose, Bld: 88 mg/dL (ref 70–99)
Potassium: 4 mmol/L (ref 3.5–5.1)
Sodium: 139 mmol/L (ref 135–145)
Total Bilirubin: 0.5 mg/dL (ref 0.0–1.2)
Total Protein: 7.2 g/dL (ref 6.5–8.1)

## 2024-04-02 LAB — URINALYSIS, ROUTINE W REFLEX MICROSCOPIC
Bilirubin Urine: NEGATIVE
Glucose, UA: NEGATIVE mg/dL
Hgb urine dipstick: NEGATIVE
Ketones, ur: NEGATIVE mg/dL
Leukocytes,Ua: NEGATIVE
Nitrite: NEGATIVE
Protein, ur: NEGATIVE mg/dL
Specific Gravity, Urine: 1.015 (ref 1.005–1.030)
pH: 6 (ref 5.0–8.0)

## 2024-04-02 MED ORDER — CYCLOBENZAPRINE HCL 5 MG PO TABS: 5.0000 mg | ORAL_TABLET | Freq: Two times a day (BID) | ORAL | 0 refills | Status: DC | PRN

## 2024-04-02 MED ORDER — KETOROLAC TROMETHAMINE 30 MG/ML IJ SOLN
30.0000 mg | Freq: Once | INTRAMUSCULAR | Status: AC
  Administered 2024-04-02: 30 mg via INTRAVENOUS
  Filled 2024-04-02: qty 1

## 2024-04-02 MED ORDER — DEXAMETHASONE SOD PHOSPHATE PF 10 MG/ML IJ SOLN
10.0000 mg | Freq: Once | INTRAMUSCULAR | Status: AC
  Administered 2024-04-02: 10 mg via INTRAVENOUS

## 2024-04-02 MED ORDER — LACTATED RINGERS IV BOLUS
500.0000 mL | Freq: Once | INTRAVENOUS | Status: AC
  Administered 2024-04-02: 500 mL via INTRAVENOUS

## 2024-04-02 MED ORDER — IOHEXOL 300 MG/ML  SOLN
100.0000 mL | Freq: Once | INTRAMUSCULAR | Status: AC | PRN
  Administered 2024-04-02: 100 mL via INTRAVENOUS

## 2024-04-02 NOTE — ED Notes (Signed)
 Pt alert and oriented X 4 at the time of discharge. RR even and unlabored. No acute distress noted. Pt verbalized understanding of discharge instructions as discussed. Pt ambulatory to lobby at time of discharge.

## 2024-04-02 NOTE — ED Triage Notes (Signed)
 Reports R sided back pain since this morning. States stood up to use restroom and unsure if she pulled something.  Denies urinary symptoms or fevers.

## 2024-04-02 NOTE — ED Provider Notes (Incomplete)
 [place] [facility] AT [facility] Provider Note   CSN: [id number] Arrival date & time: 04/02/24  9079     Patient presents with: Back Pain   STEPHENIE Russell is a 78 y.o. female.  {Add pertinent medical, surgical, social history, OB history to HPI:32957} HPI    78 year old female with a history of coronary artery disease, hypertension, kidney stones, hyperlipidemia, upcoming hip surgery in January, who presents with concern for right sided back pain.  Right lower back going up back started this AM, feels like pulling pain, when turn or try to bend down it hurts, if tries to walk it hurts No trauma, falls or injuries Left side of leg with sciatic pain for a while No abdominal pain, no nausea, no vomiting. Has colitis with diarrhea no changes No numbness or weakness (with exception ofcarpal tunnel in hand for years) No loss of control of bowel or bladder No pain with urination No fevers No hx of IVDU, cancer hx No cp or dyspnea, hx of stent placement in 2009 (Hip to be fixed with Dr. Ernie in January) Hx of kidney stones but doesn't really feel like that but it was a long ago   Past Medical History:  Diagnosis Date   Arthritis    lower back ,hips   Cervical dysplasia 1988   Colitis    collagenous   Coronary artery disease    blockage of RCA '09   Elevated cholesterol    Heart block    History of kidney stones 2020   Hypertension    IBS (irritable bowel syndrome)    Kidney stone    Myocardial infarction (HCC)    Osteopenia 11/2018   T score -1.8 FRAX 4.7% / 1.1%.  Overall stable from prior DEXA    Prior to Admission medications   Medication Sig Start Date End Date Taking? Authorizing Provider  amLODipine (NORVASC) 5 MG tablet Take 5 mg by mouth daily.    [provider]  ascorbic acid (VITAMIN C) 500 MG tablet Take 5,000 mg by mouth daily.    [provider]  atorvastatin (LIPITOR) 40 MG tablet Take 20 mg by mouth daily.      [provider]  cetirizine (ZYRTEC) 10 MG tablet Take 10 mg by mouth daily as needed for allergies.     [provider]  cholecalciferol (VITAMIN D) 25 MCG (1000 UNIT) tablet Take 1,000 Units by mouth daily.    [provider]  cholestyramine (QUESTRAN) 4 g packet Take 4 g by mouth 2 (two) times daily as needed (colitis).  07/27/19   [provider]  docusate sodium  (COLACE) 100 MG capsule Take 1 capsule (100 mg total) by mouth 2 (two) times daily. 12/27/19   Danella Cough, PA-C  ferrous sulfate  (FERROUSUL) 325 (65 FE) MG tablet Take 1 tablet (325 mg total) by mouth 3 (three) times daily with meals for 14 days. 12/27/19 01/10/20  Danella Cough, PA-C  glycopyrrolate (ROBINUL) 2 MG tablet Take 2 mg by mouth 3 (three) times daily as needed (for colitis).     [provider]  HYDROcodone -acetaminophen  (NORCO) 7.5-325 MG tablet Take 1-2 tablets by mouth every 4 (four) hours as needed for moderate pain. 12/27/19   Danella Cough, PA-C  lidocaine  (LIDODERM ) 5 % Place 1 patch onto the skin daily as needed (back pain.).  07/27/19   [provider]  MAGNESIUM -OXIDE 400 (241.3 Mg) MG tablet Take 800 mg by mouth daily. 10/20/19   [provider]  methocarbamol  (ROBAXIN ) 500 MG tablet Take 1 tablet (500 mg total) by mouth every 6 (six) hours as needed for muscle spasms. 12/27/19   Danella Cough, PA-C  metoprolol tartrate (LOPRESSOR) 50 MG tablet Take 50 mg by mouth 2 (two) times daily. 09/11/19   [provider]  Multiple Vitamin (MULTIVITAMIN WITH MINERALS) TABS tablet Take 1 tablet by mouth daily.    [provider]  nystatin -triamcinolone  ointment (MYCOLOG) Apply 1 application topically 2 (two) times daily. Patient taking differently: Apply 1 application. topically 2 (two) times daily as needed (irritation.). 11/23/18   [name], Evalene SQUIBB, MD  olmesartan (BENICAR) 40 MG tablet Take 40 mg by mouth daily.    [provider]   Omega-3 Fatty Acids (FISH OIL) 1000 MG CAPS Take 1,000 mg by mouth daily.    [provider]  omeprazole (PRILOSEC) 40 MG capsule Take 40 mg by mouth daily as needed (heartburn/indigestion.).    [provider]  ondansetron  (ZOFRAN -ODT) 4 MG disintegrating tablet Take 1 tablet (4 mg total) by mouth every 8 (eight) hours as needed. 11/17/23   Long, Joshua G, MD  polyethylene glycol (MIRALAX  / GLYCOLAX ) 17 g packet Take 17 g by mouth 2 (two) times daily. 12/27/19   Danella Cough, PA-C  potassium chloride  SA (K-DUR,KLOR-CON ) 20 MEQ tablet Take 2 tablets (40 mEq total) by mouth daily. Patient taking differently: Take 20 mEq by mouth in the morning and at bedtime. 06/05/17   Annabell Vinie DASEN, PA-C  Probiotic Product (PROBIOTIC PO) Take 1 capsule by mouth daily.    [provider]  RESTASIS 0.05 % ophthalmic emulsion Place 1 drop into both eyes every 12 (twelve) hours.  11/06/19   [provider]  sodium chloride  (OCEAN) 0.65 % SOLN nasal spray Place 2 sprays into both nostrils 4 (four) times daily as needed for congestion.     [provider]  sucralfate  (CARAFATE ) 1 g tablet Take 1 tablet (1 g total) by mouth 4 (four) times daily -  with meals and at bedtime for 7 days. 11/17/23 11/24/23  Long, Joshua G, MD    Allergies: Codeine and Pyridium [phenazopyridine]    Review of Systems  Updated Vital Signs BP (!) 151/62   Pulse 71   Temp 97.7 F (36.5 C) (Oral)   Resp 16   SpO2 100%   Physical Exam Vitals and nursing note reviewed.  Constitutional:      General: She is not in acute distress.    Appearance: She is well-developed. She is not diaphoretic.  HENT:     Head: Normocephalic and atraumatic.  Eyes:     Conjunctiva/sclera: Conjunctivae normal.  Cardiovascular:     Rate and Rhythm: Normal rate and regular rhythm.     Pulses: Normal pulses.  Pulmonary:     Effort: Pulmonary effort is normal. No respiratory distress.  Abdominal:     General:  There is no distension.     Palpations: Abdomen is soft.     Tenderness: There is abdominal tenderness (right lateral abdominal tenderness). There is right CVA tenderness. There is no left CVA tenderness or guarding.  Musculoskeletal:        General: No tenderness.     Cervical back: Normal range of motion.  Skin:    General: Skin is warm and dry.     Findings: No erythema or rash.  Neurological:     Mental Status: She is alert and oriented to person, place, and time.  Comments: Normal strength and sensation lower extremities     (all labs ordered are listed, but only abnormal results are displayed) Labs Reviewed  URINALYSIS, ROUTINE W REFLEX MICROSCOPIC    EKG: None  Radiology: No results found.  {Document cardiac monitor, telemetry assessment procedure when appropriate:32947} Procedures   Medications Ordered in the ED - No data to display    {Click here for ABCD2, HEART and other calculators REFRESH Note before signing:1}                               78 year old female with a history of coronary artery disease, hypertension, kidney stones, hyperlipidemia, upcoming hip surgery in January, who presents with concern for right sided back pain.  Differential diagnosis includes musculoskeletal back pain, disc disease, kidney stone, cholelithiasis, renal infarct, pyelonephritis,abscess, aortic dissection,PE.  Normal bilateral upper and lower extremity pulses, quality of pain not really consistent with aortic dissection and low suspicoin for this.   Labs obtained show normal renal function, no significant transaminitis.  CT completed to evaluate for underlying renal infarct, infection, or nephrolithiasis and personally evaluated interpreted by me shows no acute findings in the abdomen or pelvis, no nephrolithiasis.  Suspect likely musculoskeletal etiology of back and side pain.  She does not have symptoms to suggest cauda equina, dural abscess, or osteomyelitis  Will give  prescription for muscle relaxant, lidocaine  patch.  Patient discharged in stable condition with understanding of reasons to return.    {Document critical care time when appropriate  Document review of labs and clinical decision tools ie CHADS2VASC2, etc  Document your independent review of radiology images and any outside records  Document your discussion with family members, caretakers and with consultants  Document social determinants of health affecting pt's care  Document your decision making why or why not admission, treatments were needed:32947:::1}   Final diagnoses:  None    ED Discharge Orders     None

## 2024-06-07 NOTE — Patient Instructions (Addendum)
 SURGICAL WAITING ROOM VISITATION Patients having surgery or a procedure may have no more than 2 support people in the waiting area - these visitors may rotate in the visitor waiting room.   If the patient needs to stay at the hospital during part of their recovery, the visitor guidelines for inpatient rooms apply.  PRE-OP VISITATION  Pre-op nurse will coordinate an appropriate time for 1 support person to accompany the patient in pre-op.  This support person may not rotate.  This visitor will be contacted when the time is appropriate for the visitor to come back in the pre-op area.  Please refer to the [facility] website for the visitor guidelines for Inpatients (after your surgery is over and you are in a regular room).  Temporary Visitor Restrictions  Children ages 46 and under will not be able to visit patients in [facility] under most circumstances. Visitation is not restricted outside of hospitals unless noted otherwise in the [facility] and Location Specific Visitation Guidelines at :      [email or web address] Visitors with respiratory illnesses are discouraged from visiting and should remain at home.  You are not required to quarantine at this time prior to your surgery. However, you must do this: Hand Hygiene often Do NOT share personal items Notify your provider if you are in close contact with someone who has COVID or you develop fever 100.4 or greater, new onset of sneezing, cough, sore throat, shortness of breath or body aches.  If you test positive for Covid or have been in contact with anyone that has tested positive in the last 10 days please notify you surgeon.    Your procedure is scheduled on:  Tuesday  06-20-2024  Report to [facility] Main Entrance: [address] entrance where the [employer] is available.   Report to admitting at: 09:00    AM  Call this number if you have any questions or problems the morning of surgery [phone]  Do not eat food  after Midnight the night prior to your surgery/procedure.  After Midnight you may have the following liquids until 08:30 AM DAY OF SURGERY  Clear Liquid Diet Water Black Coffee (sugar ok, NO MILK/CREAM OR CREAMERS)  Tea (sugar ok, NO MILK/CREAM OR CREAMERS) regular and decaf                             Plain Jell-O  with no fruit (NO RED)                                           Fruit ices (not with fruit pulp, NO RED)                                     Popsicles (NO RED)                                                                  Juice: NO CITRUS JUICES: only apple, WHITE grape, WHITE cranberry Sports drinks like Gatorade or Powerade (NO RED)  The day of surgery:  Drink ONE (1) Pre-Surgery Clear Ensure at 08:30  AM the morning of surgery. Drink in one sitting. Do not sip.  This drink was given to you during your hospital pre-op appointment visit. Nothing else to drink after completing the Pre-Surgery Clear Ensure : No candy, chewing gum or throat lozenges.    FOLLOW ANY ADDITIONAL PRE OP INSTRUCTIONS YOU RECEIVED FROM YOUR SURGEON'S OFFICE!!!   Oral Hygiene is also important to reduce your risk of infection.        Remember - BRUSH YOUR TEETH THE MORNING OF SURGERY WITH YOUR REGULAR TOOTHPASTE  Do NOT smoke after Midnight the night before surgery.  STOP TAKING all Vitamins, Herbs and supplements 1 week before your surgery.   Take ONLY these medicines the morning of surgery with A SIP OF WATER: Amlodipine, Carvedilol, Pantoprazole, cetirizine Flonase  nasal spray, ans Tylenol  if needed for pain.                   You may not have any metal on your body including hair pins, jewelry, and body piercing  Do not wear make-up, lotions, powders, perfumes or deodorant  Do not wear nail polish including gel and S&S, artificial / acrylic nails, or any other type of covering on natural nails including finger and toenails. If you have artificial nails, gel coating,  etc., that needs to be removed by a nail salon, Please have this removed prior to surgery. Not doing so may mean that your surgery could be cancelled or delayed if the Surgeon or anesthesia staff feels like they are unable to monitor you safely.   Do not shave 48 hours prior to surgery to avoid nicks in your skin which may contribute to postoperative infections.   Contacts, Hearing Aids, dentures or bridgework may not be worn into surgery. DENTURES WILL BE REMOVED PRIOR TO SURGERY PLEASE DO NOT APPLY Poly grip OR ADHESIVES!!!  You may bring a small overnight bag with you on the day of surgery, only pack items that are not valuable. [facility] IS NOT RESPONSIBLE   FOR VALUABLES THAT ARE LOST OR STOLEN.   Do not bring your home medications to the hospital. The Pharmacy will dispense medications listed on your medication list to you during your admission in the Hospital.  Special Instructions: Bring a copy of your healthcare power of attorney and living will documents the day of surgery, if you wish to have them scanned into your [facility] Records- EPIC  Please read over the following fact sheets you were given: IF YOU HAVE QUESTIONS ABOUT YOUR PRE-OP INSTRUCTIONS, PLEASE CALL [phone].      Pre-operative 4 CHG Bath Instructions   You can play a key role in reducing the risk of infection after surgery. Your skin needs to be as free of germs as possible. You can reduce the number of germs on your skin by washing with CHG (chlorhexidine  gluconate) soap before surgery. CHG is an antiseptic soap that kills germs and continues to kill germs even after washing.   DO NOT use if you have an allergy to chlorhexidine /CHG or antibacterial soaps. If your skin becomes reddened or irritated, stop using the CHG and notify one of our RNs at [phone]  Please shower with the CHG soap starting 4 days before surgery using the following schedule:   FRIDAY 06-16-2024     Do NOT use CHG soap  the morning of your                                                                                                                                 surgery.         Please keep in mind the following:  DO NOT shave, including legs and underarms, starting the day of your first shower.   You may shave your face at any point before/day of surgery.  Place clean sheets on your bed the day you start using CHG soap. Use a clean washcloth (not used since being washed) for each shower. DO NOT sleep with pets once you start using the CHG.  CHG Shower Instructions:  If you choose to wash your hair and private area, wash first with your normal shampoo/soap.  After you use shampoo/soap, rinse your hair and body thoroughly to remove shampoo/soap residue.  Turn the water OFF and apply about 3 tablespoons (45 ml) of CHG soap to a CLEAN washcloth.  Apply CHG soap ONLY FROM YOUR NECK DOWN TO YOUR TOES (washing for 3-5 minutes)  DO NOT use CHG soap on face, private areas, open wounds, or sores.  Pay special attention to the area where your surgery is being performed.  If you are having back surgery, having someone wash your back for you may be helpful. Wait 2 minutes after CHG soap is applied, then you may rinse off the CHG soap.  Pat dry with a clean towel  Put on clean clothes/pajamas   If you choose to wear lotion, please use ONLY the CHG-compatible lotions on the back of this paper.     Additional instructions for the day of surgery: DO NOT APPLY any CHG Soap,  lotions, deodorants, cologne, or perfumes on the day of surgery  Put on clean/comfortable clothes.  Brush your teeth.  Ask your nurse before applying any prescription medications to the skin.   CHG Compatible Lotions   Aveeno Moisturizing lotion  Cetaphil Moisturizing Cream  Cetaphil Moisturizing Lotion  Clairol Herbal Essence  Moisturizing Lotion, Dry Skin  Clairol Herbal Essence Moisturizing Lotion, Extra Dry Skin  Clairol Herbal Essence Moisturizing Lotion, Normal Skin  Curel Age Defying Therapeutic Moisturizing Lotion with Alpha Hydroxy  Curel Extreme Care Body Lotion  Curel Soothing Hands Moisturizing Hand Lotion  Curel Therapeutic Moisturizing Cream, Fragrance-Free  Curel Therapeutic Moisturizing Lotion, Fragrance-Free  Curel Therapeutic Moisturizing Lotion, Original Formula  Eucerin Daily Replenishing Lotion  Eucerin Dry Skin Therapy Plus Alpha Hydroxy Crme  Eucerin Dry Skin Therapy Plus Alpha Hydroxy Lotion  Eucerin Original Crme  Eucerin Original Lotion  Eucerin Plus Crme Eucerin Plus Lotion  Eucerin TriLipid Replenishing Lotion  Keri Anti-Bacterial Hand Lotion  Keri Deep Conditioning Original Lotion Dry Skin Formula Softly Scented  Keri Deep Conditioning Original Lotion, Fragrance Free Sensitive Skin Formula  Keri Lotion Fast Absorbing Fragrance Free Sensitive Skin Formula  Keri Lotion Fast Absorbing Softly Scented Dry Skin Formula  Keri Original Lobbyist Skin Renewal Lotion Keri Silky Smooth Lotion  Keri Silky Smooth Sensitive Skin Lotion  Nivea Body Creamy Conditioning Oil  Nivea Body Extra Enriched Teacher, Adult Education Moisturizing Lotion Nivea Crme  Nivea Skin Firming Lotion  NutraDerm 30 Skin Lotion  NutraDerm Skin Lotion  NutraDerm Therapeutic Skin Cream  NutraDerm Therapeutic Skin Lotion  ProShield Protective Hand Cream  Provon moisturizing lotion   FAILURE TO FOLLOW THESE INSTRUCTIONS MAY RESULT IN THE CANCELLATION OF YOUR SURGERY  PATIENT SIGNATURE_________________________________  NURSE SIGNATURE__________________________________  ________________________________________________________________________        Erika Russell    An incentive spirometer is a tool that can help keep your lungs clear and active. This tool  measures how well you are filling your lungs with each breath. Taking long deep breaths may help reverse or decrease the chance of developing breathing (pulmonary) problems (especially infection) following: A long period of time when you are unable to move or be active. BEFORE THE PROCEDURE  If the spirometer includes an indicator to show your best effort, your nurse or respiratory therapist will set it to a desired goal. If possible, sit up straight or lean slightly forward. Try not to slouch. Hold the incentive spirometer in an upright position. INSTRUCTIONS FOR USE  Sit on the edge of your bed if possible, or sit up as far as you can in bed or on a chair. Hold the incentive spirometer in an upright position. Breathe out normally. Place the mouthpiece in your mouth and seal your lips tightly around it. Breathe in slowly and as deeply as possible, raising the piston or the ball toward the top of the column. Hold your breath for 3-5 seconds or for as long as possible. Allow the piston or ball to fall to the bottom of the column. Remove the mouthpiece from your mouth and breathe out normally. Rest for a few seconds and repeat Steps 1 through 7 at least 10 times every 1-2 hours when you are awake. Take your time and take a few normal breaths between deep breaths. The spirometer may include an indicator to show your best effort. Use the indicator as a goal to work toward during each repetition. After each set of 10 deep breaths, practice coughing to be sure your lungs are clear. If you have an incision (the cut made at the time of surgery), support your incision when coughing by placing a pillow or rolled up towels firmly against it. Once you are able to get out of bed, walk around indoors and cough well. You may stop using the incentive spirometer when instructed by your caregiver.  RISKS AND COMPLICATIONS Take your time so you do not get dizzy or light-headed. If you are in pain, you may need to  take or ask for pain medication before doing incentive spirometry. It is harder to take a deep breath if you are having pain. AFTER USE Rest and breathe slowly and easily. It can be helpful to keep track of a log of your progress. Your caregiver can provide you with a simple table to help with this. If you are using the spirometer at home, follow these instructions: SEEK MEDICAL CARE IF:  You are having difficultly using the spirometer. You have trouble using the spirometer as often as instructed. Your pain medication is not giving enough relief while using the spirometer. You develop fever of 100.5 F (38.1 C) or higher.  SEEK IMMEDIATE MEDICAL CARE IF:  You cough up bloody sputum that had not been present before. You develop fever of 102 F (38.9 C) or greater. You develop worsening pain at or near the incision site. MAKE SURE YOU:  Understand these instructions. Will watch your condition. Will get help right away if you are not doing well or get worse. Document Released: 09/28/2006 Document Revised: 08/10/2011 Document Reviewed: 11/29/2006 [facility] Patient Information 2014 [place], [place].        WHAT IS A BLOOD TRANSFUSION? Blood Transfusion Information  A transfusion is the replacement of blood or some of its parts. Blood is made up of multiple cells which provide different functions. Red blood cells carry oxygen and are used for blood loss replacement. White blood cells fight against infection. Platelets control bleeding. Plasma helps clot blood. Other blood products are available for specialized needs, such as hemophilia or other clotting disorders. BEFORE THE TRANSFUSION  Who gives blood for transfusions?  Healthy volunteers who are fully evaluated to make sure their blood is safe. This is blood bank blood. Transfusion therapy is the safest it has ever been in the practice  of medicine. Before blood is taken from a donor, a complete history is taken to make sure that person has no history of diseases nor engages in risky social behavior (examples are intravenous drug use or sexual activity with multiple partners). The donor's travel history is screened to minimize risk of transmitting infections, such as malaria. The donated blood is tested for signs of infectious diseases, such as HIV and hepatitis. The blood is then tested to be sure it is compatible with you in order to minimize the chance of a transfusion reaction. If you or a relative donates blood, this is often done in anticipation of surgery and is not appropriate for emergency situations. It takes many days to process the donated blood. RISKS AND COMPLICATIONS Although transfusion therapy is very safe and saves many lives, the main dangers of transfusion include:  Getting an infectious disease. Developing a transfusion reaction. This is an allergic reaction to something in the blood you were given. Every precaution is taken to prevent this. The decision to have a blood transfusion has been considered carefully by your caregiver before blood is given. Blood is not given unless the benefits outweigh the risks. AFTER THE TRANSFUSION Right after receiving a blood transfusion, you will usually feel much better and more energetic. This is especially true if your red blood cells have gotten low (anemic). The transfusion raises the level of the red blood cells which carry oxygen, and this usually causes an energy increase. The nurse administering the transfusion will monitor you carefully for complications. HOME CARE INSTRUCTIONS  No special instructions are needed after a transfusion. You may find your energy is better. Speak with your caregiver about any limitations on activity for underlying diseases you may have. SEEK MEDICAL CARE IF:  Your condition is not improving after your transfusion. You develop redness or  irritation at the intravenous (IV) site. SEEK IMMEDIATE MEDICAL CARE IF:  Any of the following symptoms occur over the next 12 hours: Shaking chills. You have a temperature by mouth above 102 F (38.9 C), not controlled by medicine. Chest, back, or muscle pain. People around you feel you are not acting correctly or are confused. Shortness of breath or difficulty breathing. Dizziness and fainting. You get a rash or develop hives. You have a decrease in urine output. Your urine turns a dark color or changes  to pink, red, or brown. Any of the following symptoms occur over the next 10 days: You have a temperature by mouth above 102 F (38.9 C), not controlled by medicine. Shortness of breath. Weakness after normal activity. The white part of the eye turns yellow (jaundice). You have a decrease in the amount of urine or are urinating less often. Your urine turns a dark color or changes to pink, red, or brown. Document Released: 05/15/2000 Document Revised: 08/10/2011 Document Reviewed: 01/02/2008 [facility] Patient Information 2014 [place], [place].  _______________________________________________________________________        If you would like to see a video about joint replacement:   [email or web address]

## 2024-06-07 NOTE — Progress Notes (Signed)
 COVID Vaccine received:  []  No [x]  Yes Date of any COVID positive Test in last 90 days:  PCP - Gaither Gell, MD Clearance received per H&P in [place]  Cardiologist - Dr. Alverna Sieving, Charon Flavia Bracket, NP at [facility] Clearance received per H&P in [place]   Chest x-ray - 2016 2v EKG -  11-19-2023 Stress Test -  ECHO - 07-01-2020  in [place]  Cardiac Cath - 2009  CT Coronary Calcium score:   Pacemaker / ICD device []  No []  Yes   Spinal Cord Stimulator:[]  No []  Yes       History of Sleep Apnea? []  No []  Yes   CPAP used?- []  No []  Yes    Medication on DOS: Amlodipine, Carvedilol, Pantoprazole, cetirizine Flonase  nasal spray, an Tylenol   Hold DOS:  Olmesartan,  Patient has: []  NO Hx DM   []  Pre-DM   []  DM1  []   DM2 Does the patient monitor blood sugar?   []  N/A   []  No []  Yes  Last A1c was:        on      Does patient have a [employer] or Dexcom? []  No []  Yes   Fasting Blood Sugar Ranges-  Checks Blood Sugar _____ times a day  Blood Thinner / Instructions: Aspirin  Instructions:  ASA 81 mg  Activity level: Able to walk up 2 flights of stairs without becoming significantly short of breath or having chest pain?   []    Yes   []  No,  would have:  Patient can perform ADLs without assistance.  []   Yes    []  No  Comments:   Anesthesia review: Hx STEMI -LHC - DESx1 in 2009, HTN, some days smoker, anemia  Patient denies any S&S of respiratory illness or Covid - no shortness of breath, fever, cough or chest pain at PAT appointment.  Patient verbalized understanding and agreement to the Pre-Surgical Instructions that were given to them at this PAT appointment. Patient was also educated of the need to review these PAT instructions again prior to her surgery.I reviewed the appropriate phone numbers to call if they have any and questions or concerns.

## 2024-06-08 ENCOUNTER — Other Ambulatory Visit: Payer: Self-pay

## 2024-06-08 ENCOUNTER — Encounter (HOSPITAL_COMMUNITY): Payer: Self-pay

## 2024-06-08 ENCOUNTER — Encounter (HOSPITAL_COMMUNITY)
Admission: RE | Admit: 2024-06-08 | Discharge: 2024-06-08 | Disposition: A | Discharge status: Home / Self Care | Source: Ambulatory Visit | Attending: Orthopedic Surgery | Admitting: Orthopedic Surgery

## 2024-06-08 VITALS — BP 155/68 | HR 68 | Temp 98.6°F | Resp 16 | Ht 63.0 in | Wt 124.0 lb

## 2024-06-08 DIAGNOSIS — Z01812 Encounter for preprocedural laboratory examination: Secondary | ICD-10-CM | POA: Insufficient documentation

## 2024-06-08 DIAGNOSIS — I2510 Atherosclerotic heart disease of native coronary artery without angina pectoris: Secondary | ICD-10-CM | POA: Insufficient documentation

## 2024-06-08 DIAGNOSIS — Z79899 Other long term (current) drug therapy: Secondary | ICD-10-CM | POA: Insufficient documentation

## 2024-06-08 DIAGNOSIS — Z01818 Encounter for other preprocedural examination: Secondary | ICD-10-CM | POA: Diagnosis present

## 2024-06-08 DIAGNOSIS — R002 Palpitations: Secondary | ICD-10-CM | POA: Insufficient documentation

## 2024-06-08 DIAGNOSIS — K449 Diaphragmatic hernia without obstruction or gangrene: Secondary | ICD-10-CM | POA: Insufficient documentation

## 2024-06-08 DIAGNOSIS — M1611 Unilateral primary osteoarthritis, right hip: Secondary | ICD-10-CM | POA: Diagnosis not present

## 2024-06-08 DIAGNOSIS — E785 Hyperlipidemia, unspecified: Secondary | ICD-10-CM | POA: Diagnosis not present

## 2024-06-08 DIAGNOSIS — D649 Anemia, unspecified: Secondary | ICD-10-CM | POA: Insufficient documentation

## 2024-06-08 DIAGNOSIS — K219 Gastro-esophageal reflux disease without esophagitis: Secondary | ICD-10-CM | POA: Insufficient documentation

## 2024-06-08 DIAGNOSIS — I10 Essential (primary) hypertension: Secondary | ICD-10-CM | POA: Insufficient documentation

## 2024-06-08 DIAGNOSIS — Z955 Presence of coronary angioplasty implant and graft: Secondary | ICD-10-CM | POA: Diagnosis not present

## 2024-06-08 HISTORY — DX: Personal history of other diseases of the digestive system: Z87.19

## 2024-06-08 HISTORY — DX: Gastro-esophageal reflux disease without esophagitis: K21.9

## 2024-06-08 LAB — CBC
HCT: 35.5 % — ABNORMAL LOW (ref 36.0–46.0)
Hemoglobin: 11.5 g/dL — ABNORMAL LOW (ref 12.0–15.0)
MCH: 21.7 pg — ABNORMAL LOW (ref 26.0–34.0)
MCHC: 32.4 g/dL (ref 30.0–36.0)
MCV: 67 fL — ABNORMAL LOW (ref 80.0–100.0)
Platelets: 179 K/uL (ref 150–400)
RBC: 5.3 MIL/uL — ABNORMAL HIGH (ref 3.87–5.11)
RDW: 16 % — ABNORMAL HIGH (ref 11.5–15.5)
WBC: 8.1 K/uL (ref 4.0–10.5)
nRBC: 0 % (ref 0.0–0.2)

## 2024-06-08 LAB — TYPE AND SCREEN
ABO/RH(D): B POS
Antibody Screen: NEGATIVE

## 2024-06-08 LAB — BASIC METABOLIC PANEL WITH GFR
Anion gap: 13 (ref 5–15)
BUN: 12 mg/dL (ref 8–23)
CO2: 25 mmol/L (ref 22–32)
Calcium: 9.2 mg/dL (ref 8.9–10.3)
Chloride: 101 mmol/L (ref 98–111)
Creatinine, Ser: 0.65 mg/dL (ref 0.44–1.00)
GFR, Estimated: >60 mL/min
Glucose, Bld: 82 mg/dL (ref 70–99)
Potassium: 3.7 mmol/L (ref 3.5–5.1)
Sodium: 138 mmol/L (ref 135–145)

## 2024-06-08 LAB — SURGICAL PCR SCREEN
MRSA, PCR: NEGATIVE
Staphylococcus aureus: NEGATIVE

## 2024-06-08 NOTE — Progress Notes (Addendum)
 COVID Vaccine received:  []  No [x]  Yes Date of any COVID positive Test in last 90 days: none PCP - Gaither Gell, MD Clearance received per H&P in [place]  Cardiologist - Dr. Alverna Sieving, Charon Flavia Bracket, NP at [facility] Clearance received per H&P in [place]   Chest x-ray - 2016 2v EKG -  11-19-2023 Stress Test -  ECHO - 07-01-2020  in [place]  Cardiac Cath - 2009  CT Coronary Calcium score:   Pacemaker / ICD device [x]  No []  Yes   Spinal Cord Stimulator:[x]  No []  Yes       History of Sleep Apnea? [x]  No []  Yes   CPAP used?- [x]  No []  Yes    Medication on DOS: Amlodipine, Carvedilol, Pantoprazole, cetirizine Flonase  nasal spray, an Tylenol   Hold DOS:  Olmesartan,  Patient has: [x]  NO Hx DM   []  Pre-DM   []  DM1  []   DM2 Does the patient monitor blood sugar?   [x]  N/A   []  No []  Yes  Last A1c was:        on      Does patient have a [employer] or Dexcom? [x]  No []  Yes   Fasting Blood Sugar Ranges-  Checks Blood Sugar _____ times a day  Blood Thinner / Instructions: Aspirin  Instructions:  ASA 81 mg- Instructed by Dr's office to stop 5 days prior to surgery. Last dose to be 06/14/24  Activity level: Able to walk up 2 flights of stairs without becoming significantly short of breath or having chest pain?   [x]    Yes   []  No,  would have:  Patient can perform ADLs without assistance.  [x]   Yes    []  No  Comments:   Anesthesia review: Hx STEMI -LHC - DESx1 in 2009, HTN, , anemia  Patient denies any S&S of respiratory illness or Covid - no shortness of breath, fever, cough or chest pain at PAT appointment.  Patient verbalized understanding and agreement to the Pre-Surgical Instructions that were given to them at this PAT appointment. Patient was also educated of the need to review these PAT instructions again prior to her surgery.I reviewed the appropriate phone numbers to call if they have any and questions or concerns.

## 2024-06-09 NOTE — Anesthesia Preprocedure Evaluation (Addendum)
 "                                  Anesthesia Evaluation  Patient identified by MRN, date of birth, ID band Patient awake    Reviewed: Allergy & Precautions, NPO status , Patient's Chart, lab work & pertinent test results  History of Anesthesia Complications Negative for: history of anesthetic complications  Airway Mallampati: II  TM Distance: >3 FB Neck ROM: Full    Dental  (+) Teeth Intact, Dental Advisory Given   Pulmonary neg shortness of breath, neg sleep apnea, neg COPD, neg recent URI, former smoker   breath sounds clear to auscultation       Cardiovascular hypertension, Pt. on medications and Pt. on home beta blockers (-) angina + CAD, + Past MI and + Cardiac Stents  (-) DOE + dysrhythmias  Rhythm:Regular  Hx/o MI with Stent placement 2009  EKG 11/19/23 NSR, diffuse T wave inversion inferolateral leads  TTE 07/01/2020 (Care Everywhere): FINDINGS  LEFT VENTRICLE  Mild left ventricular hypertrophy. The left ventricular size is  normal. False chord  normal variant  noted. LV ejection fraction = 65-  70%. Left ventricular filling pattern is normal.   -  RIGHT VENTRICLE  The right ventricle is normal in size and function.   LEFT ATRIUM  The left atrial size is normal.   RIGHT ATRIUM  Right atrial size is normal. There is no Doppler evidence for a patent  foramen ovale.  -  AORTIC VALVE  The aortic valve is trileaflet. There is no aortic stenosis.  -  MITRAL VALVE  The mitral valve leaflets appear normal. There is trace mitral  regurgitation.  -  TRICUSPID VALVE  Structurally normal tricuspid valve. There is trace tricuspid  regurgitation. Estimated right ventricular systolic pressure is 29  mmHg.  -  PULMONIC VALVE  The pulmonic valve is not well visualized. There is no pulmonic  valvular stenosis. Trace pulmonic valvular regurgitation.  -  ARTERIES  The aortic sinus is normal size. The ascending aorta is normal size.  -  VENOUS  IVC  size was normal.  -  EFFUSION  There is no pericardial effusion. There is no pleural effusion.         Neuro/Psych neg Seizures negative neurological ROS  negative psych ROS   GI/Hepatic Neg liver ROS, hiatal hernia,GERD  Medicated and Controlled,,  Endo/Other  negative endocrine ROS  HLD  Renal/GU Renal InsufficiencyRenal diseaseLab Results      Component                Value               Date                      NA                       138                 06/08/2024                K                        3.7                 06/08/2024  CO2                      25                  06/08/2024                GLUCOSE                  82                  06/08/2024                BUN                      12                  06/08/2024                CREATININE               0.65                06/08/2024                CALCIUM                   9.2                 06/08/2024                GFRNONAA                 >60                 06/08/2024                Musculoskeletal  (+) Arthritis , Osteoarthritis,  OA right hip   Abdominal   Peds  Hematology  (+) Blood dyscrasia, anemia Lab Results      Component                Value               Date                      WBC                      8.1                 06/08/2024                HGB                      11.5 (L)            06/08/2024                HCT                      35.5 (L)            06/08/2024                MCV                      67.0 (L)            06/08/2024  PLT                      179                 06/08/2024              Anesthesia Other Findings   Reproductive/Obstetrics                              Anesthesia Physical Anesthesia Plan  ASA: 3  Anesthesia Plan: Spinal   Post-op Pain Management: Ofirmev  IV (intra-op)*   Induction: Intravenous  PONV Risk Score and Plan: 3 and Treatment may vary due to age or medical condition,  Propofol  infusion and Ondansetron   Airway Management Planned: Natural Airway, Simple Face Mask and Nasal Cannula  Additional Equipment: None  Intra-op Plan:   Post-operative Plan:   Informed Consent: I have reviewed the patients History and Physical, chart, labs and discussed the procedure including the risks, benefits and alternatives for the proposed anesthesia with the patient or authorized representative who has indicated his/her understanding and acceptance.     Dental advisory given  Plan Discussed with: CRNA  Anesthesia Plan Comments: (PAT note by Lynwood Hope, PA-C:  79 year old female follows with cardiology at [facility] for history of CAD s/p stent to PDA in 2009, HTN, HLD, palpitations.  Echo 06/2020 showed LVEF 65 to 70%, mild LVH, normal RV size and function, no significant valvular abnormalities.  Last seen in follow-up 06/16/2023 and noted to be doing well, no cardiovascular complaints, no changes to management.  She was continued on atorvastatin  40 mg, amlodipine  10 mg, olmesartan 40 mg.  Clearance from Dr. Toribio states patient is low risk, copy on chart.  Other pertinent history includes GERD/hiatal hernia on PPI.  Clearance from PCP Dr. Valma dated 03/06/2024 states patient is low risk.  Copy on chart.  Preop labs reviewed, mild anemia with hemoglobin 11.5, otherwise unremarkable.  EKG 11/17/2023: Sinus rhythm.  Rate 61.  Abnormal T, consider ischemia, diffuse leads.  No significant change.  TTE 07/01/2020 (Care Everywhere): FINDINGS  LEFT VENTRICLE  Mild left ventricular hypertrophy. The left ventricular size is  normal. False chord  normal variant  noted. LV ejection fraction = 65-  70%. Left ventricular filling pattern is normal.   -  RIGHT VENTRICLE  The right ventricle is normal in size and function.   LEFT ATRIUM  The left atrial size is normal.   RIGHT ATRIUM  Right atrial size is normal. There is no Doppler evidence for a patent  foramen ovale.  -   AORTIC VALVE  The aortic valve is trileaflet. There is no aortic stenosis.  -  MITRAL VALVE  The mitral valve leaflets appear normal. There is trace mitral  regurgitation.  -  TRICUSPID VALVE  Structurally normal tricuspid valve. There is trace tricuspid  regurgitation. Estimated right ventricular systolic pressure is 29  mmHg.  -  PULMONIC VALVE  The pulmonic valve is not well visualized. There is no pulmonic  valvular stenosis. Trace pulmonic valvular regurgitation.  -  ARTERIES  The aortic sinus is normal size. The ascending aorta is normal size.  -  VENOUS  IVC size was normal.  -  EFFUSION  There is no pericardial effusion. There is no pleural effusion.    )         Anesthesia Quick Evaluation  "

## 2024-06-09 NOTE — Progress Notes (Signed)
 Anesthesia Chart Review:  79 year old female follows with cardiology at [facility] for history of CAD s/p stent to PDA in 2009, HTN, HLD, palpitations.  Echo 06/2020 showed LVEF 65 to 70%, mild LVH, normal RV size and function, no significant valvular abnormalities.  Last seen in follow-up 06/16/2023 and noted to be doing well, no cardiovascular complaints, no changes to management.  She was continued on atorvastatin 40 mg, amlodipine 10 mg, olmesartan 40 mg.  Clearance from Dr. Toribio states patient is low risk, copy on chart.  Other pertinent history includes GERD/hiatal hernia on PPI.  Clearance from PCP Dr. Valma dated 03/06/2024 states patient is low risk.  Copy on chart.  Preop labs reviewed, mild anemia with hemoglobin 11.5, otherwise unremarkable.  EKG 11/17/2023: Sinus rhythm.  Rate 61.  Abnormal T, consider ischemia, diffuse leads.  No significant change.  TTE 07/01/2020 (Care Everywhere): FINDINGS  LEFT VENTRICLE  Mild left ventricular hypertrophy. The left ventricular size is  normal. False chord  normal variant  noted. LV ejection fraction = 65-  70%. Left ventricular filling pattern is normal.   -  RIGHT VENTRICLE  The right ventricle is normal in size and function.   LEFT ATRIUM  The left atrial size is normal.   RIGHT ATRIUM  Right atrial size is normal. There is no Doppler evidence for a patent  foramen ovale.  -  AORTIC VALVE  The aortic valve is trileaflet. There is no aortic stenosis.  -  MITRAL VALVE  The mitral valve leaflets appear normal. There is trace mitral  regurgitation.  -  TRICUSPID VALVE  Structurally normal tricuspid valve. There is trace tricuspid  regurgitation. Estimated right ventricular systolic pressure is 29  mmHg.  -  PULMONIC VALVE  The pulmonic valve is not well visualized. There is no pulmonic  valvular stenosis. Trace pulmonic valvular regurgitation.  -  ARTERIES  The aortic sinus is normal size. The ascending aorta is normal size.   -  VENOUS  IVC size was normal.  -  EFFUSION  There is no pericardial effusion. There is no pleural effusion.     Lynwood Geofm RIGGERS [facility] Short Stay Center/Anesthesiology Phone [phone] 06/09/2024 4:09 PM

## 2024-06-20 ENCOUNTER — Ambulatory Visit (HOSPITAL_COMMUNITY): Payer: Self-pay | Admitting: Physician Assistant

## 2024-06-20 ENCOUNTER — Ambulatory Visit (HOSPITAL_COMMUNITY): Payer: Self-pay | Admitting: Anesthesiology

## 2024-06-20 ENCOUNTER — Ambulatory Visit (HOSPITAL_COMMUNITY)

## 2024-06-20 ENCOUNTER — Encounter (HOSPITAL_COMMUNITY): Admission: RE | Disposition: A | Payer: Self-pay | Source: Ambulatory Visit | Attending: Orthopedic Surgery

## 2024-06-20 ENCOUNTER — Other Ambulatory Visit: Payer: Self-pay

## 2024-06-20 ENCOUNTER — Observation Stay (HOSPITAL_COMMUNITY)
Admission: RE | Admit: 2024-06-20 | Discharge: 2024-06-22 | Disposition: A | Discharge status: Home / Self Care | Source: Ambulatory Visit | Attending: Orthopedic Surgery | Admitting: Orthopedic Surgery

## 2024-06-20 ENCOUNTER — Encounter (HOSPITAL_COMMUNITY): Payer: Self-pay | Admitting: Orthopedic Surgery

## 2024-06-20 DIAGNOSIS — Z87891 Personal history of nicotine dependence: Secondary | ICD-10-CM | POA: Insufficient documentation

## 2024-06-20 DIAGNOSIS — M25551 Pain in right hip: Secondary | ICD-10-CM | POA: Diagnosis present

## 2024-06-20 DIAGNOSIS — I10 Essential (primary) hypertension: Secondary | ICD-10-CM | POA: Insufficient documentation

## 2024-06-20 DIAGNOSIS — I2510 Atherosclerotic heart disease of native coronary artery without angina pectoris: Secondary | ICD-10-CM | POA: Insufficient documentation

## 2024-06-20 DIAGNOSIS — Z79899 Other long term (current) drug therapy: Secondary | ICD-10-CM | POA: Diagnosis not present

## 2024-06-20 DIAGNOSIS — M1611 Unilateral primary osteoarthritis, right hip: Secondary | ICD-10-CM

## 2024-06-20 DIAGNOSIS — Z96641 Presence of right artificial hip joint: Secondary | ICD-10-CM

## 2024-06-20 DIAGNOSIS — Z955 Presence of coronary angioplasty implant and graft: Secondary | ICD-10-CM | POA: Diagnosis not present

## 2024-06-20 HISTORY — PX: TOTAL HIP ARTHROPLASTY: SHX124

## 2024-06-20 MED ORDER — SIMETHICONE 80 MG PO CHEW: 80.0000 mg | CHEWABLE_TABLET | Freq: Four times a day (QID) | ORAL | Status: DC | PRN

## 2024-06-20 MED ORDER — SODIUM CHLORIDE (PF) 0.9 % IJ SOLN
INTRAMUSCULAR | Status: AC
  Filled 2024-06-20: qty 50

## 2024-06-20 MED ORDER — IRBESARTAN 300 MG PO TABS
300.0000 mg | ORAL_TABLET | Freq: Every day | ORAL | Status: DC
  Administered 2024-06-21 – 2024-06-22 (×2): 300 mg via ORAL
  Filled 2024-06-20: qty 2
  Filled 2024-06-20 (×2): qty 1
  Filled 2024-06-20: qty 2

## 2024-06-20 MED ORDER — DEXAMETHASONE SOD PHOSPHATE PF 10 MG/ML IJ SOLN: 8.0000 mg | Freq: Once | INTRAMUSCULAR | Status: DC

## 2024-06-20 MED ORDER — PHENOL 1.4 % MT LIQD: 1.0000 | OROMUCOSAL | Status: DC | PRN

## 2024-06-20 MED ORDER — ACETAMINOPHEN 10 MG/ML IV SOLN
INTRAVENOUS | Status: AC
  Filled 2024-06-20: qty 100

## 2024-06-20 MED ORDER — PHENYLEPHRINE 80 MCG/ML (10ML) SYRINGE FOR IV PUSH (FOR BLOOD PRESSURE SUPPORT)
PREFILLED_SYRINGE | INTRAVENOUS | Status: AC
  Filled 2024-06-20: qty 10

## 2024-06-20 MED ORDER — ACETAMINOPHEN 500 MG PO TABS
500.0000 mg | ORAL_TABLET | Freq: Four times a day (QID) | ORAL | Status: AC
  Administered 2024-06-20 – 2024-06-21 (×4): 500 mg via ORAL
  Filled 2024-06-20 (×3): qty 1

## 2024-06-20 MED ORDER — ONDANSETRON HCL 4 MG/2ML IJ SOLN
INTRAMUSCULAR | Status: AC
  Filled 2024-06-20: qty 2

## 2024-06-20 MED ORDER — DIPHENHYDRAMINE HCL 12.5 MG/5ML PO ELIX: 12.5000 mg | ORAL_SOLUTION | ORAL | Status: DC | PRN

## 2024-06-20 MED ORDER — DEXAMETHASONE SOD PHOSPHATE PF 10 MG/ML IJ SOLN
10.0000 mg | Freq: Once | INTRAMUSCULAR | Status: AC
  Administered 2024-06-21: 10 mg via INTRAVENOUS
  Filled 2024-06-20: qty 1

## 2024-06-20 MED ORDER — 0.9 % SODIUM CHLORIDE (POUR BTL) OPTIME
TOPICAL | Status: DC | PRN
  Administered 2024-06-20: 1000 mL

## 2024-06-20 MED ORDER — PHENYLEPHRINE HCL-NACL 20-0.9 MG/250ML-% IV SOLN
INTRAVENOUS | Status: DC | PRN
  Administered 2024-06-20: 50 ug/min via INTRAVENOUS

## 2024-06-20 MED ORDER — PROPOFOL 500 MG/50ML IV EMUL
INTRAVENOUS | Status: DC | PRN
  Administered 2024-06-20: 50 ug/kg/min via INTRAVENOUS

## 2024-06-20 MED ORDER — OXYCODONE HCL 5 MG/5ML PO SOLN: 5.0000 mg | Freq: Once | ORAL | Status: DC | PRN

## 2024-06-20 MED ORDER — LIDOCAINE HCL (PF) 2 % IJ SOLN
INTRAMUSCULAR | Status: AC
  Filled 2024-06-20: qty 5

## 2024-06-20 MED ORDER — PROPOFOL 10 MG/ML IV BOLUS
INTRAVENOUS | Status: DC | PRN
  Administered 2024-06-20 (×3): 20 mg via INTRAVENOUS

## 2024-06-20 MED ORDER — BUPIVACAINE IN DEXTROSE 0.75-8.25 % IT SOLN
INTRATHECAL | Status: DC | PRN
  Administered 2024-06-20: 1.6 mL via INTRATHECAL

## 2024-06-20 MED ORDER — FENTANYL CITRATE (PF) 50 MCG/ML IJ SOSY
25.0000 ug | PREFILLED_SYRINGE | INTRAMUSCULAR | Status: DC | PRN
  Administered 2024-06-20 (×2): 50 ug via INTRAVENOUS

## 2024-06-20 MED ORDER — ONDANSETRON HCL 4 MG/2ML IJ SOLN: 4.0000 mg | Freq: Four times a day (QID) | INTRAMUSCULAR | Status: DC | PRN

## 2024-06-20 MED ORDER — PROPOFOL 10 MG/ML IV BOLUS
INTRAVENOUS | Status: AC
  Filled 2024-06-20: qty 20

## 2024-06-20 MED ORDER — CEFAZOLIN SODIUM-DEXTROSE 2-4 GM/100ML-% IV SOLN
2.0000 g | INTRAVENOUS | Status: AC
  Administered 2024-06-20: 2 g via INTRAVENOUS
  Filled 2024-06-20: qty 100

## 2024-06-20 MED ORDER — CARVEDILOL 12.5 MG PO TABS
12.5000 mg | ORAL_TABLET | Freq: Two times a day (BID) | ORAL | Status: DC
  Administered 2024-06-20 – 2024-06-22 (×3): 12.5 mg via ORAL
  Filled 2024-06-20 (×4): qty 1

## 2024-06-20 MED ORDER — OXYCODONE HCL 5 MG PO TABS: 5.0000 mg | ORAL_TABLET | Freq: Once | ORAL | Status: DC | PRN

## 2024-06-20 MED ORDER — FENTANYL CITRATE (PF) 100 MCG/2ML IJ SOLN
INTRAMUSCULAR | Status: AC
  Filled 2024-06-20: qty 2

## 2024-06-20 MED ORDER — CHLORHEXIDINE GLUCONATE 0.12 % MT SOLN
15.0000 mL | Freq: Once | OROMUCOSAL | Status: AC
  Administered 2024-06-20: 15 mL via OROMUCOSAL

## 2024-06-20 MED ORDER — PANTOPRAZOLE SODIUM 40 MG PO TBEC
40.0000 mg | DELAYED_RELEASE_TABLET | Freq: Every day | ORAL | Status: DC
  Administered 2024-06-21 – 2024-06-22 (×2): 40 mg via ORAL
  Filled 2024-06-20 (×2): qty 1

## 2024-06-20 MED ORDER — METOCLOPRAMIDE HCL 5 MG/ML IJ SOLN: 5.0000 mg | Freq: Three times a day (TID) | INTRAMUSCULAR | Status: DC | PRN

## 2024-06-20 MED ORDER — ACETAMINOPHEN 10 MG/ML IV SOLN
INTRAVENOUS | Status: DC | PRN
  Administered 2024-06-20: 1000 mg via INTRAVENOUS

## 2024-06-20 MED ORDER — METHOCARBAMOL 500 MG PO TABS
500.0000 mg | ORAL_TABLET | Freq: Four times a day (QID) | ORAL | Status: DC | PRN
  Administered 2024-06-20 – 2024-06-21 (×3): 500 mg via ORAL
  Filled 2024-06-20 (×3): qty 1

## 2024-06-20 MED ORDER — FENTANYL CITRATE (PF) 50 MCG/ML IJ SOSY
PREFILLED_SYRINGE | INTRAMUSCULAR | Status: AC
  Filled 2024-06-20: qty 2

## 2024-06-20 MED ORDER — BUPIVACAINE-EPINEPHRINE (PF) 0.25% -1:200000 IJ SOLN
INTRAMUSCULAR | Status: AC
  Filled 2024-06-20: qty 30

## 2024-06-20 MED ORDER — ORAL CARE MOUTH RINSE: 15.0000 mL | Freq: Once | OROMUCOSAL | Status: AC

## 2024-06-20 MED ORDER — POTASSIUM CHLORIDE CRYS ER 20 MEQ PO TBCR
20.0000 meq | EXTENDED_RELEASE_TABLET | Freq: Two times a day (BID) | ORAL | Status: DC
  Administered 2024-06-20: 20 meq via ORAL
  Filled 2024-06-20: qty 1

## 2024-06-20 MED ORDER — ALUM & MAG HYDROXIDE-SIMETH 200-200-20 MG/5ML PO SUSP: 30.0000 mL | ORAL | Status: DC | PRN

## 2024-06-20 MED ORDER — DEXAMETHASONE SOD PHOSPHATE PF 10 MG/ML IJ SOLN
INTRAMUSCULAR | Status: AC
  Filled 2024-06-20: qty 1

## 2024-06-20 MED ORDER — AMLODIPINE BESYLATE 5 MG PO TABS
5.0000 mg | ORAL_TABLET | Freq: Every day | ORAL | Status: DC
  Administered 2024-06-21 – 2024-06-22 (×2): 5 mg via ORAL
  Filled 2024-06-20 (×2): qty 1

## 2024-06-20 MED ORDER — LACTATED RINGERS IV SOLN: INTRAVENOUS | Status: DC

## 2024-06-20 MED ORDER — SODIUM CHLORIDE (PF) 0.9 % IJ SOLN
INTRAMUSCULAR | Status: DC | PRN
  Administered 2024-06-20: 61 mL

## 2024-06-20 MED ORDER — ACETAMINOPHEN 10 MG/ML IV SOLN: 1000.0000 mg | Freq: Once | INTRAVENOUS | Status: DC | PRN

## 2024-06-20 MED ORDER — SODIUM CHLORIDE 0.9 % IV SOLN: INTRAVENOUS | Status: DC

## 2024-06-20 MED ORDER — METHOCARBAMOL 1000 MG/10ML IJ SOLN
500.0000 mg | Freq: Four times a day (QID) | INTRAMUSCULAR | Status: DC | PRN
  Administered 2024-06-20: 500 mg via INTRAVENOUS

## 2024-06-20 MED ORDER — TRANEXAMIC ACID-NACL 1000-0.7 MG/100ML-% IV SOLN
1000.0000 mg | INTRAVENOUS | Status: AC
  Administered 2024-06-20: 1000 mg via INTRAVENOUS
  Filled 2024-06-20: qty 100

## 2024-06-20 MED ORDER — FENTANYL CITRATE (PF) 100 MCG/2ML IJ SOLN
INTRAMUSCULAR | Status: DC | PRN
  Administered 2024-06-20: 100 ug via INTRAVENOUS

## 2024-06-20 MED ORDER — POVIDONE-IODINE 10 % EX SWAB
2.0000 | Freq: Once | CUTANEOUS | Status: AC
  Administered 2024-06-20: 2 via TOPICAL

## 2024-06-20 MED ORDER — HYDROMORPHONE HCL 1 MG/ML IJ SOLN
0.5000 mg | INTRAMUSCULAR | Status: DC | PRN
  Administered 2024-06-20: 0.5 mg via INTRAVENOUS
  Filled 2024-06-20: qty 1

## 2024-06-20 MED ORDER — METHOCARBAMOL 1000 MG/10ML IJ SOLN
INTRAMUSCULAR | Status: AC
  Filled 2024-06-20: qty 10

## 2024-06-20 MED ORDER — ASPIRIN 81 MG PO CHEW
81.0000 mg | CHEWABLE_TABLET | Freq: Two times a day (BID) | ORAL | Status: DC
  Administered 2024-06-20 – 2024-06-22 (×4): 81 mg via ORAL
  Filled 2024-06-20 (×4): qty 1

## 2024-06-20 MED ORDER — HYDROCODONE-ACETAMINOPHEN 7.5-325 MG PO TABS
1.0000 | ORAL_TABLET | ORAL | Status: DC | PRN
  Administered 2024-06-20: 2 via ORAL
  Administered 2024-06-20: 1 via ORAL
  Filled 2024-06-20: qty 1
  Filled 2024-06-20: qty 2

## 2024-06-20 MED ORDER — POLYETHYLENE GLYCOL 3350 17 G PO PACK
17.0000 g | PACK | Freq: Two times a day (BID) | ORAL | Status: DC
  Administered 2024-06-20 – 2024-06-22 (×4): 17 g via ORAL
  Filled 2024-06-20 (×4): qty 1

## 2024-06-20 MED ORDER — MENTHOL 3 MG MT LOZG: 1.0000 | LOZENGE | OROMUCOSAL | Status: DC | PRN

## 2024-06-20 MED ORDER — PROPOFOL 1000 MG/100ML IV EMUL
INTRAVENOUS | Status: AC
  Filled 2024-06-20: qty 100

## 2024-06-20 MED ORDER — ATORVASTATIN CALCIUM 20 MG PO TABS
20.0000 mg | ORAL_TABLET | Freq: Every day | ORAL | Status: DC
  Administered 2024-06-20 – 2024-06-21 (×2): 20 mg via ORAL
  Filled 2024-06-20 (×2): qty 1

## 2024-06-20 MED ORDER — CEFAZOLIN SODIUM-DEXTROSE 2-4 GM/100ML-% IV SOLN
2.0000 g | Freq: Four times a day (QID) | INTRAVENOUS | Status: AC
  Administered 2024-06-20 (×2): 2 g via INTRAVENOUS
  Filled 2024-06-20 (×2): qty 100

## 2024-06-20 MED ORDER — DEXAMETHASONE SOD PHOSPHATE PF 10 MG/ML IJ SOLN
INTRAMUSCULAR | Status: DC | PRN
  Administered 2024-06-20: 5 mg via INTRAVENOUS

## 2024-06-20 MED ORDER — METOCLOPRAMIDE HCL 5 MG PO TABS: 5.0000 mg | ORAL_TABLET | Freq: Three times a day (TID) | ORAL | Status: DC | PRN

## 2024-06-20 MED ORDER — TRANEXAMIC ACID-NACL 1000-0.7 MG/100ML-% IV SOLN
1000.0000 mg | Freq: Once | INTRAVENOUS | Status: AC
  Administered 2024-06-20: 1000 mg via INTRAVENOUS
  Filled 2024-06-20: qty 100

## 2024-06-20 MED ORDER — SENNA 8.6 MG PO TABS
2.0000 | ORAL_TABLET | Freq: Every day | ORAL | Status: DC
  Administered 2024-06-20 – 2024-06-21 (×2): 17.2 mg via ORAL
  Filled 2024-06-20 (×2): qty 2

## 2024-06-20 MED ORDER — BISACODYL 10 MG RE SUPP: 10.0000 mg | Freq: Every day | RECTAL | Status: DC | PRN

## 2024-06-20 MED ORDER — KETOROLAC TROMETHAMINE 30 MG/ML IJ SOLN
INTRAMUSCULAR | Status: AC
  Filled 2024-06-20: qty 1

## 2024-06-20 MED ORDER — ONDANSETRON HCL 4 MG PO TABS: 4.0000 mg | ORAL_TABLET | Freq: Four times a day (QID) | ORAL | Status: DC | PRN

## 2024-06-20 MED ORDER — HYDROCODONE-ACETAMINOPHEN 5-325 MG PO TABS
1.0000 | ORAL_TABLET | ORAL | Status: DC | PRN
  Administered 2024-06-21 – 2024-06-22 (×3): 1 via ORAL
  Filled 2024-06-20 (×3): qty 1

## 2024-06-20 NOTE — Transfer of Care (Signed)
 Immediate Anesthesia Transfer of Care Note  Patient: Erika Russell  Procedure(s) Performed: ARTHROPLASTY, HIP, TOTAL, ANTERIOR APPROACH (Right: Hip)  Patient Location: PACU  Anesthesia Type:Spinal  Level of Consciousness: awake and alert   Airway & Oxygen Therapy: Patient Spontanous Breathing and Patient connected to face mask oxygen  Post-op Assessment: Report given to RN and Post -op Vital signs reviewed and stable  Post vital signs: Reviewed and stable  Last Vitals:  Vitals Value Taken Time  BP    Temp    Pulse 65 06/20/24 09:50  Resp 10 06/20/24 09:50  SpO2 100 % 06/20/24 09:50  Vitals shown include unfiled device data.  Last Pain:  Vitals:   06/20/24 0718  TempSrc: Oral  PainSc:          Complications: No notable events documented.

## 2024-06-20 NOTE — Interval H&P Note (Signed)
 History and Physical Interval Note:  06/20/2024 6:54 AM  Erika Russell  has presented today for surgery, with the diagnosis of Right hip osteoarthritis.  The various methods of treatment have been discussed with the patient and family. After consideration of risks, benefits and other options for treatment, the patient has consented to  Procedures: ARTHROPLASTY, HIP, TOTAL, ANTERIOR APPROACH (Right) as a surgical intervention.  The patient's history has been reviewed, patient examined, no change in status, stable for surgery.  I have reviewed the patient's chart and labs.  Questions were answered to the patient's satisfaction.     [name]

## 2024-06-20 NOTE — Plan of Care (Signed)

## 2024-06-20 NOTE — Anesthesia Procedure Notes (Signed)
 Spinal  Patient location during procedure: OR Start time: 06/20/2024 8:22 AM End time: 06/20/2024 8:30 AM Reason for block: surgical anesthesia  Staffing Performed: anesthesiologist  Authorized by: Leopoldo Bruckner, MD   Performed by: Leopoldo Bruckner, MD  Preanesthetic Checklist Completed: patient identified, IV checked, risks and benefits discussed, surgical consent, monitors and equipment checked, pre-op evaluation and timeout performed Spinal Block Patient position: sitting Prep: DuraPrep Patient monitoring: heart rate, cardiac monitor, continuous pulse ox and blood pressure Approach: midline Location: L3-4 Injection technique: single-shot Needle Needle type: Pencan  Needle gauge: 24 G Needle length: 9 cm Assessment Events: CSF return  Additional Notes Patient with marked curvature and compression of the lumbar spine. Attempt at L5/S1 x 1 unsuccessful. Moved to L3/4 for procedure.

## 2024-06-20 NOTE — Discharge Instructions (Signed)

## 2024-06-20 NOTE — H&P (Signed)
 TOTAL HIP ADMISSION H&P  Patient is admitted for right total hip arthroplasty.  Therapy Plans: HHPT -- needs to be set up Disposition: Home with daughter's help Planned DVT Prophylaxis: aspirin  81mg  BID DME needed: none PCP: Dr. Gwynda - clearance received Cardio: Dr. Toribio - clearance received TXA: IV Allergies: plavix, codeine, phenazopyridine, ramipril Anesthesia Concerns: BMI: 24.8 Last HgbA1c: Not diabetic   Other: - Staying overnight - Patient had HHPT last time (left [facility] 2021) and insists on this this time - prefers very minimal meds, issues with tramadol , flexeril , etc - tylenol , robaxin , hydrocodone  - NO NSAIDs - colitis - no hx of VTE or cancer   Subjective:  Chief Complaint: Right hip pain  HPI: Erika Russell, 79 y.o. female, has a history of pain and functional disability in the right hip due to arthritis and patient has failed non-surgical conservative treatments for greater than 12 weeks to include NSAID's and/or analgesics and activity modification. Onset of symptoms was gradual, starting 2 years ago with gradual worsening course since that time. The patient noted no prior surgeries on the right  hip. Patient currently rates pain in the right hip at 9 out of 10 with activity. Patient has night pain, worsening of pain with activity and weight bearing, and pain with passive range of motion. Patient has evidence of joint space narrowing by imaging studies. This condition presents safety issues increasing the risk of falls. There is no current active infection.  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  Overall stable from prior DEXA    Past Surgical History:  Procedure Laterality Date   APPENDECTOMY     CARDIAC CATHETERIZATION  2009   stent   CARDIAC STINT  09   CYSTOSCOPY/URETEROSCOPY/HOLMIUM LASER/STENT PLACEMENT Right 12/13/2017   Procedure: RIGHT URETEROSCOPY WITH RETROGRADE PYLEOGRAM/HOLMIUM LASER/STENT PLACEMENT;  Surgeon: Carolee Sherwood JONETTA DOUGLAS, MD;  Location: [facility];  Service: Urology;  Laterality: Right;   GYNECOLOGIC CRYOSURGERY     TOTAL HIP ARTHROPLASTY Left 12/26/2019   Procedure: TOTAL HIP ARTHROPLASTY ANTERIOR APPROACH;  Surgeon: Ernie Cough, MD;  Location: [facility];  Service: Orthopedics;  Laterality: Left;  70 mins    Prior to Admission medications  Medication Sig Start Date End Date Taking? Authorizing Provider  acetaminophen  (TYLENOL ) 325 MG tablet Take 650 mg by mouth every 6 (six) hours as needed for moderate pain (pain score 4-6).   Yes [provider]  amLODipine  (NORVASC ) 5 MG tablet Take 5 mg by mouth daily.   Yes [provider]  ascorbic acid (VITAMIN C) 500 MG tablet Take 5,000 mg by mouth daily.   Yes [provider]  aspirin  EC 81 MG tablet Take 81 mg by mouth daily. Swallow whole.   Yes [provider]  atorvastatin  (LIPITOR) 40 MG tablet Take 20 mg by mouth daily.    Yes [provider]  carvedilol  (COREG ) 12.5 MG tablet Take 12.5 mg by mouth 2 (two) times daily with a meal. 06/18/22  Yes [provider]  cetirizine (ZYRTEC) 10 MG tablet Take 10 mg by mouth daily.   Yes [provider]  cholecalciferol (VITAMIN D) 25 MCG (1000 UNIT) tablet  Take 1,000 Units by mouth daily.   Yes [provider]  cholestyramine (QUESTRAN) 4 g packet Take 4 g by mouth 2 (two) times daily as needed (colitis).  07/27/19  Yes [provider]  fluticasone  (FLONASE ) 50 MCG/ACT nasal spray Place 1 spray into both nostrils daily as needed for allergies or rhinitis.   Yes [provider]  glycopyrrolate (ROBINUL) 2 MG tablet Take 2 mg by mouth 3 (three) times daily as needed (for colitis).    Yes [provider]  lidocaine  (LIDODERM ) 5 % Place 1 patch onto the skin daily as needed (back pain.).  07/27/19  Yes [provider]  MAGNESIUM -OXIDE 400 (241.3 Mg) MG tablet Take 800 mg by mouth daily. 10/20/19  Yes [provider]  olmesartan (BENICAR) 40 MG tablet Take 40 mg by mouth daily.   Yes [provider]  pantoprazole  (PROTONIX ) 40 MG tablet Take 40 mg by mouth daily.   Yes [provider]  polyethylene glycol (MIRALAX  / GLYCOLAX ) 17 g packet Take 17 g by mouth 2 (two) times daily. Patient taking differently: Take 17 g by mouth daily as needed for moderate constipation. 12/27/19  Yes Babish, Donnice, PA-C  potassium chloride  SA (K-DUR,KLOR-CON ) 20 MEQ tablet Take 2 tablets (40 mEq total) by mouth daily. Patient taking differently: Take 20 mEq by mouth in the morning and at bedtime. 06/05/17  Yes Leaphart, Vinie DASEN, PA-C  simethicone  (MYLICON) 125 MG chewable tablet Chew 125 mg by mouth every 6 (six) hours as needed for flatulence.   Yes [provider]  cyclobenzaprine  (FLEXERIL ) 5 MG tablet Take 1-2 tablets (5-10 mg total) by mouth 2 (two) times daily as needed for muscle spasms. Patient not taking: Reported on 06/06/2024 04/02/24   Dreama Longs, MD  docusate sodium  (COLACE) 100 MG capsule Take 1 capsule (100 mg total) by mouth 2 (two) times daily. Patient not taking: Reported on 06/06/2024 12/27/19   Danella Donnice, PA-C  ferrous sulfate  (FERROUSUL) 325 (65 FE) MG tablet Take 1 tablet  (325 mg total) by mouth 3 (three) times daily with meals for 14 days. Patient not taking: Reported on 06/06/2024 12/27/19 01/10/20  Danella Donnice, PA-C  HYDROcodone -acetaminophen  (NORCO) 7.5-325 MG tablet Take 1-2 tablets by mouth every 4 (four) hours as needed for moderate pain. Patient not taking: Reported on 06/06/2024 12/27/19   Danella Donnice, PA-C  nystatin -triamcinolone  ointment (MYCOLOG) Apply 1 application topically 2 (two) times daily. Patient not taking: Reported on 06/06/2024 11/23/18   [name], Evalene SQUIBB, MD  ondansetron  (ZOFRAN -ODT) 4 MG disintegrating tablet Take 1 tablet (4 mg total) by mouth every 8 (eight) hours as needed. 11/17/23   Long, Fonda MATSU, MD  sucralfate  (CARAFATE ) 1 g tablet Take 1 tablet (1 g total) by mouth 4 (four) times daily -  with meals and at bedtime for 7 days. 11/17/23 11/24/23  Long, Fonda MATSU, MD    Allergies[1]  Social History   Socioeconomic History  Marital status: Widowed    Spouse name: Not on file   Number of children: Not on file   Years of education: Not on file   Highest education level: Not on file  Occupational History   Not on file  Tobacco Use   Smoking status: Former    Current packs/day: 0.25    Average packs/day: 0.3 packs/day for 15.0 years (3.8 ttl pk-yrs)    Types: Cigarettes   Smokeless tobacco: Never  Vaping Use   Vaping status: Never Used  Substance and Sexual Activity   Alcohol use: Yes    Alcohol/week: 7.0 standard drinks of alcohol    Types: 7 Standard drinks or equivalent per week    Comment: red wine x 1 nightly   Drug use: No   Sexual activity: Not Currently    Birth control/protection: Post-menopausal    Comment: 1st intercourse 30 yo-5 partners  Other Topics Concern   Not on file  Social History Narrative   Not on file   Social Drivers of Health   Tobacco Use: Medium Risk (06/08/2024)   Patient History    Smoking Tobacco Use: Former    Smokeless Tobacco Use: Never    Passive Exposure: Not on Surveyor, Minerals Strain: Not on file  Food Insecurity: Not on file  Transportation Needs: Not on file  Physical Activity: Not on file  Stress: Not on file  Social Connections: Not on file  Intimate Partner Violence: Not on file  Depression (EYV7-0): Not on file  Alcohol Screen: Not on file  Housing: Not on file  Utilities: Not on file  Health Literacy: Not on file    Tobacco Use: Medium Risk (06/08/2024)   Patient History    Smoking Tobacco Use: Former    Smokeless Tobacco Use: Never    Passive Exposure: Not on file   Social History   Substance and Sexual Activity  Alcohol Use Yes   Alcohol/week: 7.0 standard drinks of alcohol   Types: 7 Standard drinks or equivalent per week   Comment: red wine x 1 nightly    Family History  Problem Relation Age of Onset   Cancer Father        COLON   Diabetes Father    Breast cancer Sister        Age 29's    Review Of Systems: Constitutional: Constitutional: no fever, chills, night sweats, or significant weight loss. Cardiovascular: Cardiovascular: no palpitations or chest pain. Respiratory: Respiratory: no cough or shortness of breath and No COPD. Gastrointestinal: Gastrointestinal: no vomiting or nausea. Musculoskeletal: Musculoskeletal: Joint Pain and swelling in Joints. Neurologic: Neurologic: no numbness, tingling, or difficulty with balance.   Objective:  Physical Exam: Right Hip: Reproducible groin pain with passive hip ROM She does tolerate more motion than would be expected based on her radiographic findings  Left Hip: Full ROM without discomfort Tender about the gluteal region with pain radiating into the lateral lower leg  Vital signs in last 24 hours:    Imaging Review Plain radiographs demonstrate severe degenerative joint disease of the right hip. The bone quality appears to be adequate for age and reported activity level.  Assessment/Plan:  End stage arthritis, right hip  The patient history, physical  examination, clinical judgement of the provider and imaging studies are consistent with end stage degenerative joint disease of the right hip and total hip arthroplasty is deemed medically necessary. The treatment options including medical management, injection therapy, arthroscopy and arthroplasty were discussed at length.  The risks and benefits of total hip arthroplasty were presented and reviewed. The risks due to aseptic loosening, infection, stiffness, dislocation/subluxation, thromboembolic complications and other imponderables were discussed. The patient acknowledged the explanation, agreed to proceed with the plan and consent was signed. Patient is being admitted for inpatient treatment for surgery, pain control, PT, OT, prophylactic antibiotics, VTE prophylaxis, progressive ambulation and ADLs and discharge planning.The patient is planning to be discharged home.  Rosina Calin, PA-C Orthopedic Surgery [facility] [place] [phone]      [1]  Allergies Allergen Reactions   Clopidogrel Other (See Comments)    Other Reaction(s): brusing   Codeine Itching   Pyridium [Phenazopyridine] Nausea And Vomiting   Ramipril Cough and Other (See Comments)

## 2024-06-20 NOTE — Op Note (Signed)
 MEDICAL RECORD NO.: [id number]      FACILITY:  [facility]      PHYSICIAN:  Erika Russell  DATE OF BIRTH:  03/15/1946     DATE OF PROCEDURE:  06/20/2024                                 OPERATIVE REPORT         PREOPERATIVE DIAGNOSIS: right hip osteoarthritis.      POSTOPERATIVE DIAGNOSIS:  right hip osteoarthritis.      PROCEDURE:  right total hip replacement through an anterior approach   utilizing DePuy THR system, component size 52 mm pinnacle cup, a size 36+4 neutral   Altrex liner, a size 6 Hi Actis stem with a 36+8.5 delta ceramic   ball.      SURGEON:  Erika JONETTA. Russell, M.D.      ASSISTANT:  Rosina Calin, PA-C     ANESTHESIA:  Spinal.      SPECIMENS:  None.      COMPLICATIONS:  None.      BLOOD LOSS:  150 cc     DRAINS:  None.      INDICATION OF THE PROCEDURE:  Erika Russell is a 79 y.o. female who had   presented to office for evaluation of right hip pain.  Radiographs revealed   progressive degenerative changes with bone-on-bone   articulation of the  hip joint, including subchondral cystic changes and osteophytes.  The patient had painful limited range of   motion significantly affecting their overall quality of life and function.  The patient was failing to    respond to conservative measures including medications and/or injections and activity modification and at this point was ready   to proceed with more definitive measures.  Consent was obtained for   benefit of pain relief.  Specific risks of infection, DVT, component   failure, dislocation, neurovascular injury, and need for revision surgery were reviewed in the office as well discussion of   the anterior versus posterior approach were reviewed.     PROCEDURE IN DETAIL:  The patient was brought to operative theater.   Once adequate anesthesia, preoperative antibiotics, 2 gm of Ancef , 1 gm of Tranexamic Acid , and 10 mg of Decadron  were administered, the patient was positioned supine  on the [employer] table.  Once the patient was safely positioned with adequate padding and protections of bony prominences we predraped out the hip, and used fluoroscopy to confirm orientation of the pelvis.      The right hip was then prepped and draped from proximal iliac crest to   mid thigh with a shower curtain technique.      Time-out was performed identifying the patient, planned procedure, and the appropriate extremity.     An incision was then made 2 cm lateral to the   anterior superior iliac spine extending over the orientation of the   tensor fascia lata muscle and sharp dissection was carried down to the   fascia of the muscle.      The fascia was then incised.  The muscle belly was identified and swept   laterally and retractor placed along the superior neck.  Following   cauterization of the circumflex vessels and removing some pericapsular   fat, a second cobra retractor was placed on the inferior neck.  A T-capsulotomy was made along the line of the   superior neck  to the trochanteric fossa, then extended proximally and   distally.  Tag sutures were placed and the retractors were then placed   intracapsular.  We then identified the trochanteric fossa and   orientation of my neck cut and then made a neck osteotomy with the femur on traction.  The femoral   head was removed without difficulty or complication.  Traction was let   off and retractors were placed posterior and anterior around the   acetabulum.      The labrum and foveal tissue were debrided.  I began reaming with a 48 mm   reamer and reamed up to 51 mm reamer with good bony bed preparation and a 52 mm  cup was chosen.  The final 52 mm Pinnacle cup was then impacted under fluoroscopy to confirm the depth of penetration and orientation with respect to   Abduction and forward flexion.  A screw was placed into the ilium followed by the hole eliminator.  The final   36+4 neutral Altrex liner was impacted with good  visualized rim fit.  The cup was positioned anatomically within the acetabular portion of the pelvis.      At this point, the femur was rolled to 100 degrees.  Further capsule was   released off the inferior aspect of the femoral neck.  I then   released the superior capsule proximally.  With the leg in a neutral position the hook was placed laterally   along the femur under the vastus lateralis origin and elevated manually and then held in position using the hook attachment on the bed.  The leg was then extended and adducted with the leg rolled to 100   degrees of external rotation.  Retractors were placed along the medial calcar and posteriorly over the greater trochanter.  Once the proximal femur was fully   exposed, I used a box osteotome to set orientation.  I then began   broaching with the starting chili pepper broach and passed this by hand and then broached up to 6.  With the 6 broach in place I chose a high offset neck and did several trial reductions.  The offset was appropriate, leg lengths   appeared to be equal best matched with the +8.5 head ball trial confirmed radiographically.   Given these findings, I went ahead and dislocated the hip, repositioned all   retractors and positioned the right hip in the extended and abducted position.  The final 6 Hi Actis stem was   chosen and it was impacted down to the level of neck cut.  Based on this   and the trial reductions, a final 36+8.5 delta ceramic ball was chosen and   impacted onto a clean and dry trunnion, and the hip was reduced.  The   hip had been irrigated throughout the case again at this point.  I did   reapproximate the superior capsular leaflet to the anterior leaflet   using #1 Vicryl.  The fascia of the   tensor fascia lata muscle was then reapproximated using #1 Vicryl and #0 Stratafix sutures.  The   remaining wound was closed with 2-0 Vicryl and running 4-0 Monocryl.   The hip was cleaned, dried, and dressed sterilely  using Dermabond and   Aquacel dressing.  The patient was then brought   to recovery room in stable condition tolerating the procedure well.    PA Patti was present for the entirety of the case involved from   preoperative positioning,  perioperative retractor management, general   facilitation of the case, as well as primary wound closure as assistant.            Erika Russell, M.D.        06/20/2024 9:37 AM

## 2024-06-20 NOTE — Anesthesia Procedure Notes (Signed)
 Date/Time: 06/20/2024 8:10 AM  Performed by: Therisa Doyal CROME, CRNAOxygen Delivery Method: Simple face mask

## 2024-06-20 NOTE — Evaluation (Signed)
 Physical Therapy Evaluation Patient Details Name: Erika Russell MRN: [id number] DOB: 1945/10/14 Today's Date: 06/20/2024  History of Present Illness  79 yo female s/p R THA, AA on 06/21/23. PMH: OA, VAD, HH, kidney stones, HTN, IBS, MI, L THA 2021, appy  Clinical Impression  Pt is s/p THA resulting in the deficits listed below (see PT Problem List).  Pt motivated to work with PT. Pt amb ~ 16' with RW and CGA/min assist. No incr pain with mobility. Anticipate steady progress in acute setting.   Pt will benefit from acute skilled PT to increase their independence and safety with mobility to facilitate discharge.          If plan is discharge home, recommend the following: A little help with walking and/or transfers;A little help with bathing/dressing/bathroom;Help with stairs or ramp for entrance;Assist for transportation;Assistance with cooking/housework   Can travel by private vehicle        Equipment Recommendations None recommended by PT  Recommendations for Other Services       Functional Status Assessment Patient has had a recent decline in their functional status and demonstrates the ability to make significant improvements in function in a reasonable and predictable amount of time.     Precautions / Restrictions Precautions Precautions: Fall Restrictions Weight Bearing Restrictions Per Provider Order: No RLE Weight Bearing Per Provider Order: Weight bearing as tolerated      Mobility  Bed Mobility Overal bed mobility: Needs Assistance Bed Mobility: Supine to Sit     Supine to sit: Contact guard, Supervision     General bed mobility comments: for safety    Transfers Overall transfer level: Needs assistance Equipment used: Rolling walker (2 wheels) Transfers: Sit to/from Stand Sit to Stand: Contact guard assist           General transfer comment: cues for safety and hand placement    Ambulation/Gait Ambulation/Gait assistance: Contact guard  assist Gait Distance (Feet): 60 Feet Assistive device: Rolling walker (2 wheels) Gait Pattern/deviations: Step-to pattern, Step-through pattern Gait velocity: decr but functional     General Gait Details: cues for initial sequence, RW position. progression to step through pattern without incr pain  Stairs            Wheelchair Mobility     Tilt Bed    Modified Rankin (Stroke Patients Only)       Balance Overall balance assessment: Needs assistance Sitting-balance support: Feet supported Sitting balance-Leahy Scale: Good     Standing balance support: During functional activity, Reliant on assistive device for balance Standing balance-Leahy Scale: Poor Standing balance comment: reliant on device for gaynamic activity; denies falls last 3 mos. (pt dtr reports balance deficits at baseline, largely d/t leg length discrepancy)                             Pertinent Vitals/Pain Pain Assessment Pain Assessment: 0-10 Pain Score: 6  Pain Location: right hip Pain Descriptors / Indicators: Sore, Aching Pain Intervention(s): Limited activity within patient's tolerance, Monitored during session, Premedicated before session    Home Living Family/patient expects to be discharged to:: Private residence Living Arrangements: Alone Available Help at Discharge: Family (dtr assisting after surgery) Type of Home: House Home Access: Stairs to enter Entrance Stairs-Rails: Right Entrance Stairs-Number of Steps: 5     Home Equipment: [employer] (2 wheels);Cane - single point;BSC/3in1      Prior Function Prior Level of Function : Independent/Modified Independent  Mobility Comments: ind/mod I  occasional use of cane ADLs Comments: ind     Extremity/Trunk Assessment   Upper Extremity Assessment Upper Extremity Assessment: Overall WFL for tasks assessed    Lower Extremity Assessment Lower Extremity Assessment: RLE deficits/detail RLE Deficits /  Details: ankle WFL, AAROM grossly WFL, not fully tested d/t pain       Communication   Communication Communication: No apparent difficulties    Cognition Arousal: Alert     PT - Cognitive impairments: No apparent impairments                         Following commands: Intact       Cueing Cueing Techniques: Verbal cues     General Comments      Exercises     Assessment/Plan    PT Assessment Patient needs continued PT services  PT Problem List Decreased strength;Decreased mobility;Decreased range of motion;Decreased activity tolerance;Decreased balance;Decreased knowledge of use of DME;Pain       PT Treatment Interventions DME instruction;Therapeutic exercise;Gait training;Stair training;Functional mobility training;Therapeutic activities;Patient/family education    PT Goals (Current goals can be found in the Care Plan section)  Acute Rehab PT Goals PT Goal Formulation: With patient Time For Goal Achievement: 06/27/24 Potential to Achieve Goals: Good    Frequency 7X/week     Co-evaluation               AM-PAC PT 6 Clicks Mobility  Outcome Measure Help needed turning from your back to your side while in a flat bed without using bedrails?: A Little Help needed moving from lying on your back to sitting on the side of a flat bed without using bedrails?: A Little Help needed moving to and from a bed to a chair (including a wheelchair)?: A Little Help needed standing up from a chair using your arms (e.g., wheelchair or bedside chair)?: A Little Help needed to walk in hospital room?: A Little Help needed climbing 3-5 steps with a railing? : A Little 6 Click Score: 18    End of Session Equipment Utilized During Treatment: Gait belt Activity Tolerance: Patient tolerated treatment well Patient left: with call bell/phone within reach;in chair;with chair alarm set;with family/visitor present Nurse Communication: Mobility status PT Visit Diagnosis:  Other abnormalities of gait and mobility (R26.89)    Time: 8367-8343 PT Time Calculation (min) (ACUTE ONLY): 24 min   Charges:   PT Evaluation $PT Eval Low Complexity: 1 Low PT Treatments $Gait Training: 8-22 mins PT General Charges $$ ACUTE PT VISIT: 1 Visit         Rhiannan Kievit, PT  Acute Rehab Dept ([facility]) [phone]  06/20/2024   [facility] 06/20/2024, 5:07 PM

## 2024-06-21 ENCOUNTER — Encounter (HOSPITAL_COMMUNITY): Payer: Self-pay | Admitting: Orthopedic Surgery

## 2024-06-21 DIAGNOSIS — M1611 Unilateral primary osteoarthritis, right hip: Secondary | ICD-10-CM | POA: Diagnosis not present

## 2024-06-21 LAB — BASIC METABOLIC PANEL WITH GFR
Anion gap: 10 (ref 5–15)
BUN: 9 mg/dL (ref 8–23)
CO2: 25 mmol/L (ref 22–32)
Calcium: 7.9 mg/dL — ABNORMAL LOW (ref 8.9–10.3)
Chloride: 103 mmol/L (ref 98–111)
Creatinine, Ser: 0.71 mg/dL (ref 0.44–1.00)
GFR, Estimated: >60 mL/min
Glucose, Bld: 116 mg/dL — ABNORMAL HIGH (ref 70–99)
Potassium: 3.1 mmol/L — ABNORMAL LOW (ref 3.5–5.1)
Sodium: 139 mmol/L (ref 135–145)

## 2024-06-21 LAB — CBC
HCT: 27.5 % — ABNORMAL LOW (ref 36.0–46.0)
Hemoglobin: 9 g/dL — ABNORMAL LOW (ref 12.0–15.0)
MCH: 21.9 pg — ABNORMAL LOW (ref 26.0–34.0)
MCHC: 32.7 g/dL (ref 30.0–36.0)
MCV: 66.9 fL — ABNORMAL LOW (ref 80.0–100.0)
Platelets: 154 K/uL (ref 150–400)
RBC: 4.11 MIL/uL (ref 3.87–5.11)
RDW: 16 % — ABNORMAL HIGH (ref 11.5–15.5)
WBC: 12.5 K/uL — ABNORMAL HIGH (ref 4.0–10.5)
nRBC: 0 % (ref 0.0–0.2)

## 2024-06-21 MED ORDER — POTASSIUM CHLORIDE CRYS ER 20 MEQ PO TBCR
40.0000 meq | EXTENDED_RELEASE_TABLET | Freq: Two times a day (BID) | ORAL | Status: DC
  Administered 2024-06-21 – 2024-06-22 (×3): 40 meq via ORAL
  Filled 2024-06-21 (×3): qty 2

## 2024-06-21 MED ORDER — ORAL CARE MOUTH RINSE: 15.0000 mL | OROMUCOSAL | Status: DC | PRN

## 2024-06-21 NOTE — Progress Notes (Signed)
 Physical Therapy Treatment Patient Details Name: Erika Russell MRN: [id number] DOB: [birth year] Today's Date: 06/21/2024   History of Present Illness 79 yo female s/p R THA, AA on 06/21/23. PMH: OA, VAD, HH, kidney stones, HTN, IBS, MI, L THA 2021, appy    PT Comments  Pt is making excellent progress, amb hallway distance, reviewed up/down stairs. Pt is at overall supervision level. Will review HEP in afternoon PT session and continue education on safe mobility. Pt will not d/c today per PA d/t no assistance at home until tomorrow and K+ recheck needed.  Continue PT in acute setting   If plan is discharge home, recommend the following: A little help with walking and/or transfers;A little help with bathing/dressing/bathroom;Help with stairs or ramp for entrance;Assist for transportation;Assistance with cooking/housework   Can travel by private vehicle        Equipment Recommendations  None recommended by PT    Recommendations for Other Services       Precautions / Restrictions Precautions Precautions: Fall Restrictions RLE Weight Bearing Per Provider Order: Weight bearing as tolerated     Mobility  Bed Mobility Overal bed mobility: Needs Assistance Bed Mobility: Supine to Sit     Supine to sit: Supervision     General bed mobility comments: able to use gait gelt as leg lifter    Transfers Overall transfer level: Needs assistance Equipment used: Rolling walker (2 wheels) Transfers: Sit to/from Stand Sit to Stand: Contact guard assist, Supervision           General transfer comment: cues for safety and hand placement. STS from bed and toilet    Ambulation/Gait Ambulation/Gait assistance: Supervision Gait Distance (Feet): 160 Feet Assistive device: Rolling walker (2 wheels) Gait Pattern/deviations: Step-through pattern, Decreased stance time - right Gait velocity: decr but functional     General Gait Details: supervision for safety, intermittent cues for  proper RW position and step length. step through pattern with no incr pain   Stairs Stairs: Yes Stairs assistance: Contact guard assist, Supervision Stair Management: One rail Left, Step to pattern, With cane, Forwards Number of Stairs: 3 General stair comments: cues for sequence and technique   Wheelchair Mobility     Tilt Bed    Modified Rankin (Stroke Patients Only)       Balance                                            Communication Communication Communication: No apparent difficulties  Cognition Arousal: Alert Behavior During Therapy: WFL for tasks assessed/performed   PT - Cognitive impairments: No apparent impairments                         Following commands: Intact      Cueing Cueing Techniques: Verbal cues  Exercises      General Comments        Pertinent Vitals/Pain Pain Assessment Pain Assessment: 0-10 Pain Score: 4  Pain Location: right hip Pain Descriptors / Indicators: Sore, Aching Pain Intervention(s): Limited activity within patient's tolerance, Monitored during session, Premedicated before session, Repositioned, Ice applied    Home Living                          Prior Function            PT Goals (  current goals can now be found in the care plan section) Acute Rehab PT Goals PT Goal Formulation: With patient Time For Goal Achievement: 06/27/24 Potential to Achieve Goals: Good Progress towards PT goals: Progressing toward goals    Frequency    7X/week      PT Plan      Co-evaluation              AM-PAC PT 6 Clicks Mobility   Outcome Measure  Help needed turning from your back to your side while in a flat bed without using bedrails?: A Little Help needed moving from lying on your back to sitting on the side of a flat bed without using bedrails?: A Little Help needed moving to and from a bed to a chair (including a wheelchair)?: A Little Help needed standing up from a  chair using your arms (e.g., wheelchair or bedside chair)?: A Little Help needed to walk in hospital room?: A Little Help needed climbing 3-5 steps with a railing? : A Little 6 Click Score: 18    End of Session Equipment Utilized During Treatment: Gait belt Activity Tolerance: Patient tolerated treatment well Patient left: with call bell/phone within reach;in chair;with family/visitor present (chair alarm no functioning) Nurse Communication: Mobility status PT Visit Diagnosis: Other abnormalities of gait and mobility (R26.89)     Time: 8967-8948 PT Time Calculation (min) (ACUTE ONLY): 19 min  Charges:    $Gait Training: 8-22 mins PT General Charges $$ ACUTE PT VISIT: 1 Visit                     Rohnan Bartleson, PT  Acute Rehab Dept [facility]) [phone]  06/21/2024    [facility] 06/21/2024, 11:04 AM

## 2024-06-21 NOTE — TOC Transition Note (Signed)
 Transition of Care [facility]) - Discharge Note   Patient Details  Name: Erika Russell MRN: [id number] Date of Birth: Oct 20, 1945  [facility] of Care [facility] CM/SW Contact:  Heather DELENA Saltness, LCSW Phone Number: 06/21/2024, 11:20 AM   Clinical Narrative:    CSW met with pt at bedside to discuss discharge planning. Pt discharging home with HEP. Pt has needed DME. Pt's family to transport pt home upon discharge. No further TOC needs at this time.   Final next level of care: Home w Home Health Services Barriers to Discharge: Continued Medical Work up   Patient Goals and CMS Choice Patient states their goals for this hospitalization and ongoing recovery are:: To return home CMS Medicare.gov Compare Post Acute Care list provided to:: Patient Choice offered to / list presented to : Patient [place] ownership interest in [facility].provided to:: Patient    Discharge Placement Home with HEP  Patient to be transferred to facility by: Family Name of family member notified: Suzen Sprang Patient and family notified of of transfer: 06/21/24  Discharge Plan and Services Additional resources added to the After Visit Summary for  Follow Up                DME Arranged: N/A DME Agency: NA       HH Arranged: NA HH Agency: NA Date HH Agency Contacted: 06/21/24 Time HH Agency Contacted: [phone] Representative spoke with at [facility] Agency: [facility]  Social Drivers of Health (SDOH) Interventions SDOH Screenings   Food Insecurity: No Food Insecurity (06/20/2024)  Housing: Low Risk (06/20/2024)  Transportation Needs: No Transportation Needs (06/20/2024)  Utilities: Not At Risk (06/20/2024)  Social Connections: Moderately Integrated (06/20/2024)  Tobacco Use: Medium Risk (06/20/2024)     Readmission Risk Interventions     No data to display          Signed: Heather Saltness, MSW, LCSW Clinical Social Worker Inpatient Care Management 06/21/2024 11:20 AM

## 2024-06-21 NOTE — Care Management Obs Status (Signed)
 MEDICARE OBSERVATION STATUS NOTIFICATION   Patient Details  Name: AMEY HOSSAIN MRN: [id number] Date of Birth: 05/17/1946   Medicare Observation Status Notification Given:  Yes    Heather DELENA Saltness, LCSW 06/21/2024, 9:33 AM

## 2024-06-21 NOTE — Progress Notes (Signed)
" ° °  Subjective: 1 Day Post-Op Procedures (LRB): ARTHROPLASTY, HIP, TOTAL, ANTERIOR APPROACH (Right) Patient reports pain as mild.   Patient seen in rounds with Dr. Ernie. Patient is resting in bed on exam this morning. No acute events overnight. Foley catheter removed. Patient ambulated 60 feet with PT yesterday. We called her daughter Luke to be on speaker phone while we talked.  We will start therapy today.   Objective: Vital signs in last 24 hours: Temp:  [97.6 F (36.4 C)-98.5 F (36.9 C)] 98.3 F (36.8 C) (01/21 0553) Pulse Rate:  [49-71] 64 (01/21 0553) Resp:  [10-18] 18 (01/21 0553) BP: (104-144)/(45-63) 144/63 (01/21 0553) SpO2:  [93 %-100 %] 100 % (01/21 0553)  Intake/Output from previous day:  Intake/Output Summary (Last 24 hours) at 06/21/2024 0809 Last data filed at 06/21/2024 0600 Gross per 24 hour  Intake 3296.05 ml  Output 3125 ml  Net 171.05 ml     Intake/Output this shift: No intake/output data recorded.  Labs: Recent Labs    06/21/24 0324  HGB 9.0*   Recent Labs    06/21/24 0324  WBC 12.5*  RBC 4.11  HCT 27.5*  PLT 154   Recent Labs    06/21/24 0324  NA 139  K 3.1*  CL 103  CO2 25  BUN 9  CREATININE 0.71  GLUCOSE 116*  CALCIUM  7.9*   No results for input(s): LABPT, INR in the last 72 hours.  Exam: General - Patient is Alert and Oriented Extremity - Neurologically intact Sensation intact distally Intact pulses distally Dorsiflexion/Plantar flexion intact Dressing - dressing C/D/I Motor Function - intact, moving foot and toes well on exam.   Past Medical History:  Diagnosis Date   Arthritis    lower back ,hips   Cervical dysplasia 1988   Colitis    collagenous   Coronary artery disease    blockage of RCA '09   Elevated cholesterol    GERD (gastroesophageal reflux disease)    Heart block    History of hiatal hernia    History of kidney stones 2020   Hypertension    IBS (irritable bowel syndrome)    Kidney stone     Myocardial infarction (HCC)    Osteopenia 11/2018   T score -1.8 FRAX 4.7% / 1.1%.  Overall stable from prior DEXA    Assessment/Plan: 1 Day Post-Op Procedures (LRB): ARTHROPLASTY, HIP, TOTAL, ANTERIOR APPROACH (Right) Principal Problem:   S/P total right hip arthroplasty  Estimated body mass index is 21.97 kg/m as calculated from the following:   Height as of this encounter: 5' 3 (1.6 m).   Weight as of this encounter: 56.2 kg. Advance diet Up with therapy  DVT Prophylaxis - Aspirin  Weight bearing as tolerated.  Chronic hypokalemia, with K 3.1 this AM - will increase to 40 BID today  We discussed PT today, and will plan for HEP this time instead of HHPT as previously noted Up with PT today as able She does not have anyone at home to take care of her until tomorrow Will recheck potassium tomorrow, should be able to d/c tomorrow AM  Rosina Calin, PA-C Orthopedic Surgery [phone] 06/21/2024, 8:09 AM  "

## 2024-06-21 NOTE — Progress Notes (Signed)
 PT TX NOTE   06/21/24 1400  PT Visit Information  Last PT Received On 06/21/24  Assistance Needed Pt making excellent progress toward PT goals. Amb hallway distance and performed THA exercises. Pain controlled during activity. Continue PT in acute setting. F/u plan is for  HEP .  History of Present Illness 79 yo female s/p R THA, AA on 06/21/23. PMH: OA, VAD, HH, kidney stones, HTN, IBS, MI, L THA 2021, appy  Precautions  Precautions Fall  Restrictions  RLE Weight Bearing Per Provider Order WBAT  Pain Assessment  Pain Assessment 0-10  Pain Score 4  Pain Location right hip  Pain Descriptors / Indicators Sore;Aching  Pain Intervention(s) Limited activity within patient's tolerance;Monitored during session;Repositioned;RN gave pain meds during session;Ice applied  Cognition  Arousal Alert  Behavior During Therapy WFL for tasks assessed/performed  PT - Cognitive impairments No apparent impairments  Following Commands  Following commands Intact  Cueing  Cueing Techniques Verbal cues  Communication  Communication No apparent difficulties  Bed Mobility  Overal bed mobility Needs Assistance  Bed Mobility Sit to Supine  Sit to supine Supervision  General bed mobility comments for safety; reviewed use of gait belt as needed  Transfers  Overall transfer level Needs assistance  Equipment used Rolling walker (2 wheels)  Transfers Sit to/from Stand  Sit to Stand Contact guard assist;Supervision  General transfer comment cues for safety and hand placement. STS from bed and toilet  Ambulation/Gait  Ambulation/Gait assistance Supervision  Gait Distance (Feet) 140 Feet  Assistive device Rolling walker (2 wheels)  Gait Pattern/deviations Step-through pattern;Decreased stance time - right  General Gait Details supervision for safety; improved fluidity of gait pattern, with incr stance time on R compared to am session.  Gait velocity decr but functional  Balance  Standing balance support  During functional activity;Reliant on assistive device for balance;No upper extremity supported  Standing balance-Leahy Scale Fair  Total Joint Exercises  Ankle Circles/Pumps AROM;Both;10 reps  Quad Sets AROM;Both;10 reps  Heel Slides AAROM;Right;10 reps  Hip ABduction/ADduction AROM;AAROM;Right;10 reps  PT - End of Session  Equipment Utilized During Treatment Gait belt  Activity Tolerance Patient tolerated treatment well  Patient left with call bell/phone within reach;in bed;with bed alarm set;with family/visitor present (chair alarm no functioning)  Nurse Communication Mobility status   PT - Assessment/Plan  PT Visit Diagnosis Other abnormalities of gait and mobility (R26.89)  PT Frequency (ACUTE ONLY) 7X/week  Follow Up Recommendations Follow physician's recommendations for discharge plan and follow up therapies  Patient can return home with the following A little help with walking and/or transfers;A little help with bathing/dressing/bathroom;Help with stairs or ramp for entrance;Assist for transportation;Assistance with cooking/housework  PT equipment None recommended by PT  AM-PAC PT 6 Clicks Mobility Outcome Measure (Version 2)  Help needed turning from your back to your side while in a flat bed without using bedrails? 3  Help needed moving from lying on your back to sitting on the side of a flat bed without using bedrails? 3  Help needed moving to and from a bed to a chair (including a wheelchair)? 3  Help needed standing up from a chair using your arms (e.g., wheelchair or bedside chair)? 3  Help needed to walk in hospital room? 3  Help needed climbing 3-5 steps with a railing?  3  6 Click Score 18  Consider Recommendation of Discharge To: Home with [facility]  PT Goal Progression  Progress towards PT goals Progressing toward goals  Acute Rehab PT  Goals  PT Goal Formulation With patient  Time For Goal Achievement 06/27/24  Potential to Achieve Goals Good  PT Time Calculation  PT  Start Time (ACUTE ONLY) 1410  PT Stop Time (ACUTE ONLY) 1436  PT Time Calculation (min) (ACUTE ONLY) 26 min  PT General Charges  $$ ACUTE PT VISIT 1 Visit  PT Treatments  $Gait Training 8-22 mins  $Therapeutic Exercise 8-22 mins  Rexene, PT  Acute Rehab Dept ([facility]) [phone]  06/21/2024

## 2024-06-22 ENCOUNTER — Other Ambulatory Visit (HOSPITAL_COMMUNITY): Payer: Self-pay

## 2024-06-22 DIAGNOSIS — M1611 Unilateral primary osteoarthritis, right hip: Secondary | ICD-10-CM | POA: Diagnosis not present

## 2024-06-22 LAB — BASIC METABOLIC PANEL WITH GFR
Anion gap: 9 (ref 5–15)
BUN: 12 mg/dL (ref 8–23)
CO2: 25 mmol/L (ref 22–32)
Calcium: 7.7 mg/dL — ABNORMAL LOW (ref 8.9–10.3)
Chloride: 105 mmol/L (ref 98–111)
Creatinine, Ser: 0.63 mg/dL (ref 0.44–1.00)
GFR, Estimated: >60 mL/min
Glucose, Bld: 94 mg/dL (ref 70–99)
Potassium: 4.3 mmol/L (ref 3.5–5.1)
Sodium: 139 mmol/L (ref 135–145)

## 2024-06-22 LAB — CBC
HCT: 26.7 % — ABNORMAL LOW (ref 36.0–46.0)
Hemoglobin: 8.5 g/dL — ABNORMAL LOW (ref 12.0–15.0)
MCH: 21.7 pg — ABNORMAL LOW (ref 26.0–34.0)
MCHC: 31.8 g/dL (ref 30.0–36.0)
MCV: 68.3 fL — ABNORMAL LOW (ref 80.0–100.0)
Platelets: 161 K/uL (ref 150–400)
RBC: 3.91 MIL/uL (ref 3.87–5.11)
RDW: 16.1 % — ABNORMAL HIGH (ref 11.5–15.5)
WBC: 12.2 K/uL — ABNORMAL HIGH (ref 4.0–10.5)
nRBC: 0 % (ref 0.0–0.2)

## 2024-06-22 MED ORDER — HYDROCODONE-ACETAMINOPHEN 5-325 MG PO TABS
1.0000 | ORAL_TABLET | ORAL | 0 refills | Status: AC | PRN
  Filled 2024-06-22: qty 42, 7d supply, fill #0

## 2024-06-22 MED ORDER — ASPIRIN 81 MG PO CHEW
81.0000 mg | CHEWABLE_TABLET | Freq: Two times a day (BID) | ORAL | 0 refills | Status: AC
  Filled 2024-06-22: qty 56, 28d supply, fill #0

## 2024-06-22 MED ORDER — METHOCARBAMOL 500 MG PO TABS
500.0000 mg | ORAL_TABLET | Freq: Four times a day (QID) | ORAL | 0 refills | Status: AC | PRN
  Filled 2024-06-22: qty 40, 10d supply, fill #0

## 2024-06-22 MED ORDER — SENNA 8.6 MG PO TABS
2.0000 | ORAL_TABLET | Freq: Every day | ORAL | 0 refills | Status: AC
  Filled 2024-06-22: qty 28, 14d supply, fill #0

## 2024-06-22 NOTE — Progress Notes (Signed)
 Discharge medications delivered to patient at the bedside in a secure bag.

## 2024-06-22 NOTE — Progress Notes (Signed)
 Physical Therapy Treatment Patient Details Name: Erika Russell MRN: [id number] DOB: 05-06-1946 Today's Date: 06/22/2024   History of Present Illness 79 yo female s/p R THA, AA on 06/21/23. PMH: OA, VAD, HH, kidney stones, HTN, IBS, MI, L THA 2021, appy    PT Comments  Pt ambulated in hallway, practiced steps, and performed LE exercises.  Pt had no further questions and ready for d/c home today.     If plan is discharge home, recommend the following: A little help with walking and/or transfers;A little help with bathing/dressing/bathroom;Help with stairs or ramp for entrance;Assist for transportation;Assistance with cooking/housework   Can travel by private vehicle        Equipment Recommendations  None recommended by PT    Recommendations for Other Services       Precautions / Restrictions Precautions Precautions: Fall Restrictions RLE Weight Bearing Per Provider Order: Weight bearing as tolerated     Mobility  Bed Mobility Overal bed mobility: Needs Assistance Bed Mobility: Supine to Sit, Sit to Supine     Supine to sit: Supervision Sit to supine: Supervision   General bed mobility comments: for safety; pt utilized gait belt for exiting bed    Transfers Overall transfer level: Needs assistance Equipment used: Rolling walker (2 wheels) Transfers: Sit to/from Stand Sit to Stand: Supervision           General transfer comment: verbal cues for hand placement    Ambulation/Gait Ambulation/Gait assistance: Supervision Gait Distance (Feet): 240 Feet Assistive device: Rolling walker (2 wheels) Gait Pattern/deviations: Step-through pattern, Decreased stride length       General Gait Details: supervision for safety; good fluidity of gait pattern   Stairs Stairs: Yes Stairs assistance: Contact guard assist, Supervision Stair Management: One rail Left, Step to pattern, With cane, Forwards Number of Stairs: 3 General stair comments: cues for sequence and  technique; performed twice   Wheelchair Mobility     Tilt Bed    Modified Rankin (Stroke Patients Only)       Balance                                            Communication Communication Communication: No apparent difficulties  Cognition Arousal: Alert Behavior During Therapy: WFL for tasks assessed/performed   PT - Cognitive impairments: No apparent impairments                         Following commands: Intact      Cueing    Exercises Total Joint Exercises Ankle Circles/Pumps: AROM, Both, 10 reps Quad Sets: AROM, Both, 10 reps Heel Slides: AAROM, Right, 10 reps Hip ABduction/ADduction: AROM, Right, 10 reps, AAROM, Standing Long Arc Quad: AROM, Right, Seated, 10 reps Knee Flexion: 10 reps, Right, Standing, AROM Marching in Standing: AROM, Right, Standing, 10 reps Standing Hip Extension: AROM, Right, Standing, 10 reps *HEP handout in room    General Comments        Pertinent Vitals/Pain Pain Assessment Pain Assessment: 0-10 Pain Score: 5  Pain Location: right hip Pain Descriptors / Indicators: Sore, Aching Pain Intervention(s): Monitored during session, Repositioned, Premedicated before session    Home Living                          Prior Function  PT Goals (current goals can now be found in the care plan section) Progress towards PT goals: Progressing toward goals    Frequency    7X/week      PT Plan      Co-evaluation              AM-PAC PT 6 Clicks Mobility   Outcome Measure  Help needed turning from your back to your side while in a flat bed without using bedrails?: A Little Help needed moving from lying on your back to sitting on the side of a flat bed without using bedrails?: A Little Help needed moving to and from a bed to a chair (including a wheelchair)?: A Little Help needed standing up from a chair using your arms (e.g., wheelchair or bedside chair)?: A Little Help  needed to walk in hospital room?: A Little Help needed climbing 3-5 steps with a railing? : A Little 6 Click Score: 18    End of Session Equipment Utilized During Treatment: Gait belt Activity Tolerance: Patient tolerated treatment well Patient left: in bed;with call bell/phone within reach;with bed alarm set Nurse Communication: Mobility status PT Visit Diagnosis: Other abnormalities of gait and mobility (R26.89)     Time: 9050-8989 PT Time Calculation (min) (ACUTE ONLY): 21 min  Charges:    $Gait Training: 8-22 mins PT General Charges $$ ACUTE PT VISIT: 1 Visit                    Tari KLEIN, DPT Physical Therapist Acute Rehabilitation Services Office: [phone]    Tari CROME [place] 06/22/2024, 11:45 AM

## 2024-06-22 NOTE — Anesthesia Postprocedure Evaluation (Signed)
"   Anesthesia Post Note  Patient: Erika Russell  Procedure(s) Performed: ARTHROPLASTY, HIP, TOTAL, ANTERIOR APPROACH (Right: Hip)     Patient location during evaluation: PACU Anesthesia Type: Spinal and MAC Level of consciousness: awake and alert Pain management: pain level controlled Vital Signs Assessment: post-procedure vital signs reviewed and stable Respiratory status: spontaneous breathing, nonlabored ventilation and respiratory function stable Cardiovascular status: stable and blood pressure returned to baseline Postop Assessment: no apparent nausea or vomiting Anesthetic complications: no   No notable events documented.                Linken Mcglothen      "

## 2024-06-22 NOTE — Progress Notes (Signed)
" ° °  Subjective: 2 Days Post-Op Procedures (LRB): ARTHROPLASTY, HIP, TOTAL, ANTERIOR APPROACH (Right) Patient reports pain as mild.   Patient seen in rounds for Dr. Ernie. Patient is resting in bed on exam this morning. No acute events overnight. Patient reports she had a BM and has been urinating. Patient ambulated 200 feet with PT yesterday. We will start therapy today.   Objective: Vital signs in last 24 hours: Temp:  [97.9 F (36.6 C)-98.5 F (36.9 C)] 98.5 F (36.9 C) (01/22 0601) Pulse Rate:  [59-72] 72 (01/22 0601) Resp:  [16-18] 18 (01/22 0601) BP: (125-132)/(47-58) 132/58 (01/22 0601) SpO2:  [96 %-100 %] 96 % (01/22 0601)  Intake/Output from previous day:  Intake/Output Summary (Last 24 hours) at 06/22/2024 0752 Last data filed at 06/22/2024 0600 Gross per 24 hour  Intake 655.79 ml  Output 500 ml  Net 155.79 ml     Intake/Output this shift: No intake/output data recorded.  Labs: Recent Labs    06/21/24 0324 06/22/24 0326  HGB 9.0* 8.5*   Recent Labs    06/21/24 0324 06/22/24 0326  WBC 12.5* 12.2*  RBC 4.11 3.91  HCT 27.5* 26.7*  PLT 154 161   Recent Labs    06/21/24 0324 06/22/24 0326  NA 139 139  K 3.1* 4.3  CL 103 105  CO2 25 25  BUN 9 12  CREATININE 0.71 0.63  GLUCOSE 116* 94  CALCIUM  7.9* 7.7*   No results for input(s): LABPT, INR in the last 72 hours.  Exam: General - Patient is Alert and Oriented Extremity - Neurologically intact Sensation intact distally Intact pulses distally Dorsiflexion/Plantar flexion intact Dressing - dressing C/D/I Motor Function - intact, moving foot and toes well on exam.   Past Medical History:  Diagnosis Date   Arthritis    lower back ,hips   Cervical dysplasia 1988   Colitis    collagenous   Coronary artery disease    blockage of RCA '09   Elevated cholesterol    GERD (gastroesophageal reflux disease)    Heart block    History of hiatal hernia    History of kidney stones 2020    Hypertension    IBS (irritable bowel syndrome)    Kidney stone    Myocardial infarction (HCC)    Osteopenia 11/2018   T score -1.8 FRAX 4.7% / 1.1%.  Overall stable from prior DEXA    Assessment/Plan: 2 Days Post-Op Procedures (LRB): ARTHROPLASTY, HIP, TOTAL, ANTERIOR APPROACH (Right) Principal Problem:   S/P total right hip arthroplasty  Estimated body mass index is 21.97 kg/m as calculated from the following:   Height as of this encounter: 5' 3 (1.6 m).   Weight as of this encounter: 56.2 kg. Advance diet Up with therapy D/C IV fluids  DVT Prophylaxis - Aspirin  Weight bearing as tolerated.  Hgb stable at 8.5 this AM K improved to 4.3   Plan is to go Home after hospital stay. Plan for discharge today after meeting goals with therapy. Follow up in the office in 2 weeks.   Rosina Calin, PA-C Orthopedic Surgery [phone] 06/22/2024, 7:52 AM  "

## 2024-06-29 NOTE — Discharge Summary (Signed)
 Patient ID: Erika Russell MRN: [id number] DOB/AGE: November 14, 1945 79 y.o.  Admit date: 06/20/2024 Discharge date: 06/22/2024  Admission Diagnoses:  Right hip osteoarthritis  Discharge Diagnoses:  Principal Problem:   S/P total right hip arthroplasty   Past Medical History:  Diagnosis Date   Arthritis    lower back ,hips   Cervical dysplasia 1988   Colitis    collagenous   Coronary artery disease    blockage of RCA '09   Elevated cholesterol    GERD (gastroesophageal reflux disease)    Heart block    History of hiatal hernia    History of kidney stones 2020   Hypertension    IBS (irritable bowel syndrome)    Kidney stone    Myocardial infarction (HCC)    Osteopenia 11/2018   T score -1.8 FRAX 4.7% / 1.1%.  Overall stable from prior DEXA    Surgeries: Procedures: ARTHROPLASTY, HIP, TOTAL, ANTERIOR APPROACH on 06/20/2024   Consultants:   Discharged Condition: Improved  Hospital Course: Erika Russell is an 79 y.o. female who was admitted 06/20/2024 for operative treatment ofS/P total right hip arthroplasty. Patient has severe unremitting pain that affects sleep, daily activities, and work/hobbies. After pre-op clearance the patient was taken to the operating room on 06/20/2024 and underwent  Procedures: ARTHROPLASTY, HIP, TOTAL, ANTERIOR APPROACH.    Patient was given perioperative antibiotics:  Anti-infectives (From admission, onward)    Start     Dose/Rate Route Frequency Ordered Stop   06/20/24 1430  ceFAZolin  (ANCEF ) IVPB 2g/100 mL premix        2 g 200 mL/hr over 30 Minutes Intravenous Every 6 hours 06/20/24 1225 06/20/24 2045   06/20/24 0645  ceFAZolin  (ANCEF ) IVPB 2g/100 mL premix        2 g 200 mL/hr over 30 Minutes Intravenous On call to O.R. 06/20/24 9361 06/20/24 9162        Patient was given sequential compression devices, early ambulation, and chemoprophylaxis to prevent DVT. Patient worked with PT and was meeting their goals regarding safe  ambulation and transfers.  Patient benefited maximally from hospital stay and there were no complications.    Recent vital signs: No data found.   Recent laboratory studies: No results for input(s): WBC, HGB, HCT, PLT, NA, K, CL, CO2, BUN, CREATININE, GLUCOSE, INR, CALCIUM  in the last 72 hours.  Invalid input(s): PT, 2   Discharge Medications:   Allergies as of 06/22/2024       Reactions   Clopidogrel Other (See Comments)   Other Reaction(s): brusing   Codeine Itching   Pyridium [phenazopyridine] Nausea And Vomiting   Ramipril Cough, Other (See Comments)        Medication List     STOP taking these medications    acetaminophen  325 MG tablet Commonly known as: TYLENOL    aspirin  EC 81 MG tablet Replaced by: Aspirin  Low Dose 81 MG chewable tablet   cyclobenzaprine  5 MG tablet Commonly known as: FLEXERIL    HYDROcodone -acetaminophen  7.5-325 MG tablet Commonly known as: Norco Replaced by: HYDROcodone -acetaminophen  5-325 MG tablet       TAKE these medications    amLODipine  5 MG tablet Commonly known as: NORVASC  Take 5 mg by mouth daily.   ascorbic acid 500 MG tablet Commonly known as: VITAMIN C Take 5,000 mg by mouth daily.   Aspirin  Low Dose 81 MG chewable tablet Generic drug: aspirin  Chew 1 tablet (81 mg total) by mouth 2 (two) times daily for 28 days. Replaces: aspirin  EC 81  MG tablet   atorvastatin  40 MG tablet Commonly known as: LIPITOR Take 20 mg by mouth daily.   carvedilol  12.5 MG tablet Commonly known as: COREG  Take 12.5 mg by mouth 2 (two) times daily with a meal.   cetirizine 10 MG tablet Commonly known as: ZYRTEC Take 10 mg by mouth daily.   cholecalciferol 25 MCG (1000 UNIT) tablet Commonly known as: VITAMIN D3 Take 1,000 Units by mouth daily.   cholestyramine 4 g packet Commonly known as: QUESTRAN Take 4 g by mouth 2 (two) times daily as needed (colitis).   docusate sodium  100 MG capsule Commonly  known as: Colace Take 1 capsule (100 mg total) by mouth 2 (two) times daily.   ferrous sulfate  325 (65 FE) MG tablet Commonly known as: FerrouSul Take 1 tablet (325 mg total) by mouth 3 (three) times daily with meals for 14 days.   fluticasone  50 MCG/ACT nasal spray Commonly known as: FLONASE  Place 1 spray into both nostrils daily as needed for allergies or rhinitis.   glycopyrrolate 2 MG tablet Commonly known as: ROBINUL Take 2 mg by mouth 3 (three) times daily as needed (for colitis).   HYDROcodone -acetaminophen  5-325 MG tablet Commonly known as: NORCO/VICODIN Take 1 tablet by mouth every 4 (four) hours as needed for severe pain (pain score 7-10). Replaces: HYDROcodone -acetaminophen  7.5-325 MG tablet   lidocaine  5 % Commonly known as: LIDODERM  Place 1 patch onto the skin daily as needed (back pain.).   MAGnesium -Oxide 400 (241.3 Mg) MG tablet Generic drug: magnesium  oxide Take 800 mg by mouth daily.   methocarbamol  500 MG tablet Commonly known as: ROBAXIN  Take 1 tablet (500 mg total) by mouth every 6 (six) hours as needed (thigh pain).   nystatin -triamcinolone  ointment Commonly known as: MYCOLOG Apply 1 application topically 2 (two) times daily.   olmesartan 40 MG tablet Commonly known as: BENICAR Take 40 mg by mouth daily.   ondansetron  4 MG disintegrating tablet Commonly known as: ZOFRAN -ODT Take 1 tablet (4 mg total) by mouth every 8 (eight) hours as needed.   pantoprazole  40 MG tablet Commonly known as: PROTONIX  Take 40 mg by mouth daily.   polyethylene glycol 17 g packet Commonly known as: MIRALAX  / GLYCOLAX  Take 17 g by mouth 2 (two) times daily. What changed:  when to take this reasons to take this   potassium chloride  SA 20 MEQ tablet Commonly known as: KLOR-CON  M Take 2 tablets (40 mEq total) by mouth daily. What changed:  how much to take when to take this   senna 8.6 MG Tabs tablet Commonly known as: SENOKOT Take 2 tablets (17.2 mg total)  by mouth at bedtime for 14 days.   simethicone  125 MG chewable tablet Commonly known as: MYLICON Chew 125 mg by mouth every 6 (six) hours as needed for flatulence.   sucralfate  1 g tablet Commonly known as: Carafate  Take 1 tablet (1 g total) by mouth 4 (four) times daily -  with meals and at bedtime for 7 days.               Discharge Care Instructions  (From admission, onward)           Start     Ordered   06/22/24 0000  Change dressing       Comments: Maintain surgical dressing until follow up in the clinic. If the edges start to pull up, may reinforce with tape. If the dressing is no longer working, may remove and cover with gauze and tape,  but must keep the area dry and clean.  Call with any questions or concerns.   06/22/24 0756            Diagnostic Studies: DG Pelvis Portable Result Date: 06/20/2024 CLINICAL DATA:  Status post hip arthroplasty. EXAM: PORTABLE PELVIS 1-2 VIEWS COMPARISON:  None Available. FINDINGS: Right hip arthroplasty in expected alignment. No periprosthetic lucency or fracture. Recent postsurgical change includes air and edema in the soft tissues. Previous left hip arthroplasty. IMPRESSION: Right hip arthroplasty without immediate postoperative complication. Electronically Signed   By: Andrea Gasman M.D.   On: 06/20/2024 13:56   DG HIP UNILAT WITH PELVIS 1V RIGHT Result Date: 06/20/2024 CLINICAL DATA:  Elective surgery. EXAM: DG HIP (WITH OR WITHOUT PELVIS) 1V RIGHT COMPARISON:  None Available. FINDINGS: Two fluoroscopic spot views of the pelvis and right hip obtained in the operating room. Images during hip arthroplasty. Fluoroscopy time 9 seconds. Dose 0.9 mGy. Previous left hip arthroplasty. IMPRESSION: Intraoperative fluoroscopy during right hip arthroplasty. Electronically Signed   By: Andrea Gasman M.D.   On: 06/20/2024 13:56   DG C-Arm 1-60 Min-No Report Result Date: 06/20/2024 Fluoroscopy was utilized by the requesting physician.  No  radiographic interpretation.    Disposition: Discharge disposition: 01-Home or Self Care       Discharge Instructions     Call MD / Call 911   Complete by: As directed    If you experience chest pain or shortness of breath, CALL 911 and be transported to the hospital emergency room.  If you develope a fever above 101 F, pus (white drainage) or increased drainage or redness at the wound, or calf pain, call your surgeon's office.   Change dressing   Complete by: As directed    Maintain surgical dressing until follow up in the clinic. If the edges start to pull up, may reinforce with tape. If the dressing is no longer working, may remove and cover with gauze and tape, but must keep the area dry and clean.  Call with any questions or concerns.   Constipation Prevention   Complete by: As directed    Drink plenty of fluids.  Prune juice may be helpful.  You may use a stool softener, such as Colace (over the counter) 100 mg twice a day.  Use MiraLax  (over the counter) for constipation as needed.   Diet - low sodium heart healthy   Complete by: As directed    Increase activity slowly as tolerated   Complete by: As directed    Weight bearing as tolerated with assist device (walker, cane, etc) as directed, use it as long as suggested by your surgeon or therapist, typically at least 4-6 weeks.   Post-operative opioid taper instructions:   Complete by: As directed    POST-OPERATIVE OPIOID TAPER INSTRUCTIONS: It is important to wean off of your opioid medication as soon as possible. If you do not need pain medication after your surgery it is ok to stop day one. Opioids include: Codeine, Hydrocodone (Norco, Vicodin), Oxycodone (Percocet, oxycontin ) and hydromorphone  amongst others.  Long term and even short term use of opiods can cause: Increased pain response Dependence Constipation Depression Respiratory depression And more.  Withdrawal symptoms can include Flu like symptoms Nausea,  vomiting And more Techniques to manage these symptoms Hydrate well Eat regular healthy meals Stay active Use relaxation techniques(deep breathing, meditating, yoga) Do Not substitute Alcohol to help with tapering If you have been on opioids for less than two weeks and do not have  pain than it is ok to stop all together.  Plan to wean off of opioids This plan should start within one week post op of your joint replacement. Maintain the same interval or time between taking each dose and first decrease the dose.  Cut the total daily intake of opioids by one tablet each day Next start to increase the time between doses. The last dose that should be eliminated is the evening dose.      TED hose   Complete by: As directed    Use stockings (TED hose) for 2 weeks on both leg(s).  You may remove them at night for sleeping.        Contact information for follow-up providers     Ernie Cough, MD. Schedule an appointment as soon as possible for a visit in 2 week(s).   Specialty: Orthopedic Surgery Contact information: [address] [place] [address] [place] [address] [phone]              Contact information for after-discharge care     Home Medical Care     [facility]) .   Service: Home Health Services Contact information: [address] [place]  [address] [phone]                      Signed: Rosina JONELLE Calin 06/29/2024, 10:55 AM
